# Patient Record
Sex: Female | Born: 1955
Health system: Southern US, Community
[De-identification: ages and names within clinical notes are randomized; demographics above are authoritative.]

## PROBLEM LIST (undated history)

## (undated) DIAGNOSIS — K624 Stenosis of anus and rectum: Secondary | ICD-10-CM

## (undated) DIAGNOSIS — K389 Disease of appendix, unspecified: Secondary | ICD-10-CM

## (undated) DIAGNOSIS — K5790 Diverticulosis of intestine, part unspecified, without perforation or abscess without bleeding: Secondary | ICD-10-CM

## (undated) DIAGNOSIS — K429 Umbilical hernia without obstruction or gangrene: Secondary | ICD-10-CM

## (undated) DIAGNOSIS — K5100 Ulcerative (chronic) pancolitis without complications: Secondary | ICD-10-CM

## (undated) DIAGNOSIS — I10 Essential (primary) hypertension: Secondary | ICD-10-CM

## (undated) DIAGNOSIS — E538 Deficiency of other specified B group vitamins: Secondary | ICD-10-CM

## (undated) DIAGNOSIS — K6289 Other specified diseases of anus and rectum: Secondary | ICD-10-CM

## (undated) DIAGNOSIS — K5010 Crohn's disease of large intestine without complications: Secondary | ICD-10-CM

## (undated) DIAGNOSIS — N8110 Cystocele, unspecified: Secondary | ICD-10-CM

## (undated) DIAGNOSIS — D509 Iron deficiency anemia, unspecified: Secondary | ICD-10-CM

## (undated) HISTORY — PX: INCISIONAL HERNIA REPAIR: SHX193

## (undated) HISTORY — PX: TUBAL LIGATION: SHX77

## (undated) HISTORY — DX: Other specified diseases of anus and rectum: K62.89

## (undated) HISTORY — DX: Ulcerative (chronic) pancolitis without complications: K51.00

## (undated) HISTORY — DX: Umbilical hernia without obstruction or gangrene: K42.9

## (undated) HISTORY — DX: Deficiency of other specified B group vitamins: E53.8

## (undated) HISTORY — DX: Crohn's disease of large intestine without complications: K50.10

## (undated) HISTORY — PX: DILATION AND CURETTAGE OF UTERUS: SHX78

## (undated) HISTORY — DX: Essential (primary) hypertension: I10

## (undated) HISTORY — DX: Iron deficiency anemia, unspecified: D50.9

## (undated) HISTORY — DX: Diverticulosis of intestine, part unspecified, without perforation or abscess without bleeding: K57.90

## (undated) HISTORY — PX: COLONOSCOPY: SHX174

## (undated) HISTORY — DX: Stenosis of anus and rectum: K62.4

## (undated) HISTORY — DX: Disease of appendix, unspecified: K38.9

## (undated) HISTORY — DX: Cystocele, unspecified: N81.10

## (undated) HISTORY — PX: APPENDECTOMY: SHX54

---

## 1998-01-22 ENCOUNTER — Other Ambulatory Visit: Admission: RE | Admit: 1998-01-22 | Discharge: 1998-01-22 | Payer: Self-pay | Admitting: Gynecology

## 1999-01-26 ENCOUNTER — Other Ambulatory Visit: Admission: RE | Admit: 1999-01-26 | Discharge: 1999-01-26 | Payer: Self-pay | Admitting: Gynecology

## 1999-08-18 ENCOUNTER — Encounter: Admission: RE | Admit: 1999-08-18 | Discharge: 1999-08-18 | Payer: Self-pay | Admitting: Gynecology

## 1999-08-18 ENCOUNTER — Encounter: Payer: Self-pay | Admitting: Gynecology

## 2000-02-11 ENCOUNTER — Encounter: Admission: RE | Admit: 2000-02-11 | Discharge: 2000-02-11 | Payer: Self-pay | Admitting: Gynecology

## 2000-02-11 ENCOUNTER — Encounter: Payer: Self-pay | Admitting: Gynecology

## 2000-02-16 ENCOUNTER — Other Ambulatory Visit: Admission: RE | Admit: 2000-02-16 | Discharge: 2000-02-16 | Payer: Self-pay | Admitting: Gynecology

## 2001-01-26 ENCOUNTER — Other Ambulatory Visit: Admission: RE | Admit: 2001-01-26 | Discharge: 2001-01-26 | Payer: Self-pay | Admitting: Gynecology

## 2001-02-10 ENCOUNTER — Encounter: Payer: Self-pay | Admitting: Gynecology

## 2001-02-10 ENCOUNTER — Encounter: Admission: RE | Admit: 2001-02-10 | Discharge: 2001-02-10 | Payer: Self-pay | Admitting: Gynecology

## 2001-02-16 ENCOUNTER — Encounter: Payer: Self-pay | Admitting: Gynecology

## 2001-02-16 ENCOUNTER — Encounter: Admission: RE | Admit: 2001-02-16 | Discharge: 2001-02-16 | Payer: Self-pay | Admitting: Gynecology

## 2001-08-22 ENCOUNTER — Encounter: Admission: RE | Admit: 2001-08-22 | Discharge: 2001-08-22 | Payer: Self-pay | Admitting: Gynecology

## 2001-08-22 ENCOUNTER — Encounter: Payer: Self-pay | Admitting: Gynecology

## 2002-02-06 ENCOUNTER — Other Ambulatory Visit: Admission: RE | Admit: 2002-02-06 | Discharge: 2002-02-06 | Payer: Self-pay | Admitting: Gynecology

## 2002-02-20 ENCOUNTER — Encounter: Admission: RE | Admit: 2002-02-20 | Discharge: 2002-02-20 | Payer: Self-pay | Admitting: Gynecology

## 2002-02-20 ENCOUNTER — Encounter: Payer: Self-pay | Admitting: Gynecology

## 2003-04-17 ENCOUNTER — Encounter: Admission: RE | Admit: 2003-04-17 | Discharge: 2003-04-17 | Payer: Self-pay | Admitting: Gynecology

## 2003-04-17 ENCOUNTER — Encounter: Payer: Self-pay | Admitting: Gynecology

## 2003-05-20 ENCOUNTER — Other Ambulatory Visit: Admission: RE | Admit: 2003-05-20 | Discharge: 2003-05-20 | Payer: Self-pay | Admitting: Gynecology

## 2004-04-27 ENCOUNTER — Encounter: Admission: RE | Admit: 2004-04-27 | Discharge: 2004-04-27 | Payer: Self-pay | Admitting: Gynecology

## 2004-11-26 ENCOUNTER — Other Ambulatory Visit: Admission: RE | Admit: 2004-11-26 | Discharge: 2004-11-26 | Payer: Self-pay | Admitting: Gynecology

## 2006-01-13 ENCOUNTER — Encounter: Admission: RE | Admit: 2006-01-13 | Discharge: 2006-01-13 | Payer: Self-pay | Admitting: Gynecology

## 2006-01-13 ENCOUNTER — Other Ambulatory Visit: Admission: RE | Admit: 2006-01-13 | Discharge: 2006-01-13 | Payer: Self-pay | Admitting: Gynecology

## 2007-02-13 ENCOUNTER — Encounter: Admission: RE | Admit: 2007-02-13 | Discharge: 2007-02-13 | Payer: Self-pay | Admitting: Gynecology

## 2008-02-15 ENCOUNTER — Encounter: Admission: RE | Admit: 2008-02-15 | Discharge: 2008-02-15 | Payer: Self-pay | Admitting: Gynecology

## 2009-02-17 ENCOUNTER — Encounter: Admission: RE | Admit: 2009-02-17 | Discharge: 2009-02-17 | Payer: Self-pay | Admitting: Gynecology

## 2010-02-23 ENCOUNTER — Encounter: Admission: RE | Admit: 2010-02-23 | Discharge: 2010-02-23 | Payer: Self-pay | Admitting: Gynecology

## 2011-04-16 ENCOUNTER — Other Ambulatory Visit: Payer: Self-pay | Admitting: Gynecology

## 2011-04-16 DIAGNOSIS — Z1231 Encounter for screening mammogram for malignant neoplasm of breast: Secondary | ICD-10-CM

## 2011-05-31 ENCOUNTER — Ambulatory Visit
Admission: RE | Admit: 2011-05-31 | Discharge: 2011-05-31 | Disposition: A | Discharge status: Home / Self Care | Payer: BC Managed Care – PPO | Source: Ambulatory Visit | Attending: Gynecology | Admitting: Gynecology

## 2011-05-31 DIAGNOSIS — Z1231 Encounter for screening mammogram for malignant neoplasm of breast: Secondary | ICD-10-CM

## 2012-04-20 ENCOUNTER — Other Ambulatory Visit: Payer: Self-pay | Admitting: Gynecology

## 2012-04-20 DIAGNOSIS — Z1231 Encounter for screening mammogram for malignant neoplasm of breast: Secondary | ICD-10-CM

## 2012-06-19 ENCOUNTER — Ambulatory Visit
Admission: RE | Admit: 2012-06-19 | Discharge: 2012-06-19 | Disposition: A | Discharge status: Home / Self Care | Payer: BC Managed Care – PPO | Source: Ambulatory Visit | Attending: Gynecology | Admitting: Gynecology

## 2012-06-19 DIAGNOSIS — Z1231 Encounter for screening mammogram for malignant neoplasm of breast: Secondary | ICD-10-CM

## 2013-04-27 ENCOUNTER — Other Ambulatory Visit: Payer: Self-pay

## 2013-04-27 DIAGNOSIS — Z1231 Encounter for screening mammogram for malignant neoplasm of breast: Secondary | ICD-10-CM

## 2013-06-25 ENCOUNTER — Ambulatory Visit
Admission: RE | Admit: 2013-06-25 | Discharge: 2013-06-25 | Disposition: A | Discharge status: Home / Self Care | Payer: BC Managed Care – PPO | Source: Ambulatory Visit

## 2013-06-25 DIAGNOSIS — Z1231 Encounter for screening mammogram for malignant neoplasm of breast: Secondary | ICD-10-CM

## 2013-06-28 ENCOUNTER — Other Ambulatory Visit: Payer: Self-pay | Admitting: Gynecology

## 2013-06-28 DIAGNOSIS — R928 Other abnormal and inconclusive findings on diagnostic imaging of breast: Secondary | ICD-10-CM

## 2013-07-03 ENCOUNTER — Ambulatory Visit
Admission: RE | Admit: 2013-07-03 | Discharge: 2013-07-03 | Disposition: A | Discharge status: Home / Self Care | Payer: Self-pay | Source: Ambulatory Visit | Attending: Gynecology | Admitting: Gynecology

## 2013-07-03 DIAGNOSIS — R928 Other abnormal and inconclusive findings on diagnostic imaging of breast: Secondary | ICD-10-CM

## 2014-05-08 ENCOUNTER — Other Ambulatory Visit: Payer: Self-pay

## 2014-05-08 DIAGNOSIS — Z1231 Encounter for screening mammogram for malignant neoplasm of breast: Secondary | ICD-10-CM

## 2014-06-27 ENCOUNTER — Ambulatory Visit
Admission: RE | Admit: 2014-06-27 | Discharge: 2014-06-27 | Disposition: A | Discharge status: Home / Self Care | Payer: BC Managed Care – PPO | Source: Ambulatory Visit

## 2014-06-27 ENCOUNTER — Encounter (INDEPENDENT_AMBULATORY_CARE_PROVIDER_SITE_OTHER): Payer: Self-pay

## 2014-06-27 DIAGNOSIS — Z1231 Encounter for screening mammogram for malignant neoplasm of breast: Secondary | ICD-10-CM

## 2015-05-23 ENCOUNTER — Other Ambulatory Visit: Payer: Self-pay

## 2015-05-23 DIAGNOSIS — Z1231 Encounter for screening mammogram for malignant neoplasm of breast: Secondary | ICD-10-CM

## 2015-07-31 ENCOUNTER — Ambulatory Visit
Admission: RE | Admit: 2015-07-31 | Discharge: 2015-07-31 | Disposition: A | Discharge status: Home / Self Care | Payer: BLUE CROSS/BLUE SHIELD | Source: Ambulatory Visit

## 2015-07-31 DIAGNOSIS — Z1231 Encounter for screening mammogram for malignant neoplasm of breast: Secondary | ICD-10-CM

## 2015-08-15 ENCOUNTER — Telehealth: Payer: Self-pay | Admitting: Internal Medicine

## 2015-08-15 NOTE — Telephone Encounter (Signed)
Received records from [facility]. Patient is requesting to see Dr. Hilarie Fredrickson. Patient is requesting a 2nd opinion regarding crohn's disease. Records placed on Dr. Vena Rua desk for review.

## 2015-08-26 ENCOUNTER — Encounter: Payer: Self-pay | Admitting: Internal Medicine

## 2015-08-26 NOTE — Telephone Encounter (Signed)
Dr. Hilarie Fredrickson reviewed records and has accepted patient. Ok to schedule OV. Left message for patient to return my call.

## 2015-09-10 NOTE — Telephone Encounter (Signed)
Appointment scheduled for 10/31/15 with Dr. Hilarie Fredrickson

## 2015-10-21 ENCOUNTER — Encounter: Payer: Self-pay | Admitting: *Deleted

## 2015-10-31 ENCOUNTER — Encounter: Payer: Self-pay | Admitting: Internal Medicine

## 2015-10-31 ENCOUNTER — Other Ambulatory Visit (INDEPENDENT_AMBULATORY_CARE_PROVIDER_SITE_OTHER): Payer: BLUE CROSS/BLUE SHIELD

## 2015-10-31 ENCOUNTER — Ambulatory Visit (INDEPENDENT_AMBULATORY_CARE_PROVIDER_SITE_OTHER): Payer: BLUE CROSS/BLUE SHIELD | Admitting: Internal Medicine

## 2015-10-31 VITALS — BP 134/64 | HR 72 | Ht 62.4 in | Wt 173.0 lb

## 2015-10-31 DIAGNOSIS — K5080 Crohn's disease of both small and large intestine without complications: Secondary | ICD-10-CM | POA: Diagnosis not present

## 2015-10-31 DIAGNOSIS — K50119 Crohn's disease of large intestine with unspecified complications: Secondary | ICD-10-CM

## 2015-10-31 DIAGNOSIS — K5010 Crohn's disease of large intestine without complications: Secondary | ICD-10-CM

## 2015-10-31 LAB — COMPREHENSIVE METABOLIC PANEL
ALBUMIN: 4.1 g/dL (ref 3.5–5.2)
ALT: 16 U/L (ref 0–35)
AST: 18 U/L (ref 0–37)
Alkaline Phosphatase: 100 U/L (ref 39–117)
BUN: 15 mg/dL (ref 6–23)
CALCIUM: 9.6 mg/dL (ref 8.4–10.5)
CHLORIDE: 104 meq/L (ref 96–112)
CO2: 29 meq/L (ref 19–32)
Creatinine, Ser: 0.75 mg/dL (ref 0.40–1.20)
GFR: 83.84 mL/min (ref >60.00)
Glucose, Bld: 98 mg/dL (ref 70–99)
Potassium: 3.7 mEq/L (ref 3.5–5.1)
Sodium: 139 mEq/L (ref 135–145)
Total Bilirubin: 0.5 mg/dL (ref 0.2–1.2)
Total Protein: 7.4 g/dL (ref 6.0–8.3)

## 2015-10-31 LAB — CBC WITH DIFFERENTIAL/PLATELET
BASOS PCT: 0.2 % (ref 0.0–3.0)
Basophils Absolute: 0 10*3/uL (ref 0.0–0.1)
EOS ABS: 0.1 10*3/uL (ref 0.0–0.7)
EOS PCT: 2.1 % (ref 0.0–5.0)
HEMATOCRIT: 36.5 % (ref 36.0–46.0)
HEMOGLOBIN: 12 g/dL (ref 12.0–15.0)
LYMPHS PCT: 21.8 % (ref 12.0–46.0)
Lymphs Abs: 1.1 10*3/uL (ref 0.7–4.0)
MCHC: 32.9 g/dL (ref 30.0–36.0)
MCV: 79.1 fl (ref 78.0–100.0)
MONOS PCT: 8.5 % (ref 3.0–12.0)
Monocytes Absolute: 0.4 10*3/uL (ref 0.1–1.0)
NEUTROS ABS: 3.3 10*3/uL (ref 1.4–7.7)
Neutrophils Relative %: 67.4 % (ref 43.0–77.0)
PLATELETS: 312 10*3/uL (ref 150.0–400.0)
RBC: 4.61 Mil/uL (ref 3.87–5.11)
RDW: 15 % (ref 11.5–15.5)
WBC: 5 10*3/uL (ref 4.0–10.5)

## 2015-10-31 LAB — VITAMIN B12: VITAMIN B 12: 303 pg/mL (ref 211–911)

## 2015-10-31 LAB — HIGH SENSITIVITY CRP: CRP HIGH SENSITIVITY: 15.46 mg/L — AB (ref 0.000–5.000)

## 2015-10-31 LAB — IGA: IGA: 317 mg/dL (ref 68–378)

## 2015-10-31 MED ORDER — MESALAMINE ER 500 MG PO CPCR: 1000.0000 mg | ORAL_CAPSULE | Freq: Three times a day (TID) | ORAL | Status: DC

## 2015-10-31 NOTE — Patient Instructions (Addendum)
Your physician has requested that you go to the basement for lab work before leaving today. We have sent  medications to your pharmacy for you to pick up at your convenience. Stop taking Lialda. Follow up in 3 months.

## 2015-10-31 NOTE — Progress Notes (Signed)
Patient ID: Donna Richmond, female   DOB: 02-12-1956, 60 y.o.   MRN: [id number] HPI: Donna Richmond is a 60 yo female with PMH of ileocolonic Crohn's disease, hypertension and B12 deficiency who is seen to obtain second opinion for her history of Crohn's disease. She was diagnosed with ileitis consistent with Crohn's disease at the time of screening colonoscopy in 2013. Because of lack of symptoms no therapy was given. She had multiple discussions with Dr. Lyndel Richmond regarding the diagnosis. Repeat colonoscopy was performed in October 2016 which showed terminal ileal stricture with surrounding ulcerations and erythema. Ulcerations also involving the ileocecal valve but not the cecum. The majority of the colon was normal but there were a few aphthous erosions in the rectum within 5 cm of the anal verge. There was also thickening in the anorectum felt to be scar formation which was also biopsied. These pathology results showed chronic active ileitis with ulceration and villous atrophy from the terminal ileum. In the rectum was chronic active proctitis with ulceration. In the anorectum there was anorectal mucosa with mild chronic active inflammation and foci of superficial mucosal erosion. After this procedure she was initially started on apriso and was changed to Lialda 2.4 g daily on samples. Both drugs have been cost prohibitive.  She is here today with her daughter and husband.  She denies issues with diarrhea or abdominal pain. She does have sensitivities in her diet which can cause crampy abdominal pain and bloating. These are food specifically salad and milk. For some reason french fries seems to bother her. If she avoids these foods she denies issues with abdominal pain and diarrhea. She reports one formed stool a day without blood or melena. She denies perianal pain or drainage. Good appetite and no upper GI or hepatobiliary complaint. No dysphagia or odynophagia. Prior to the [place] she had noticed some  thinner stools but this has improved with the Lialda. No family history of colon cancer or IBD.  Past Medical History  Diagnosis Date  . Ulcerative ileocolitis ([place])   . Hypertension   . B12 deficiency   . Proctitis     Past Surgical History  Procedure Laterality Date  . Dilation and curettage of uterus    . Tubal ligation     Meds Lialda 2.4 g daily tribenzor 40-5-12.5 mg daily   Family History  Problem Relation Age of Onset  . Colon cancer Neg Hx     Social History  Substance Use Topics  . Smoking status: Never Smoker   . Smokeless tobacco: Never Used  . Alcohol Use: No    ROS: As per history of present illness, otherwise negative  BP 134/64 mmHg  Pulse 72  Ht 5' 2.4" (1.585 m)  Wt 173 lb (78.472 kg)  BMI 31.24 kg/m2 Constitutional: Well-developed and well-nourished. No distress. HEENT: Normocephalic and atraumatic. Oropharynx is clear and moist. No oropharyngeal exudate. Conjunctivae are normal.  No scleral icterus. Neck: Neck supple. Trachea midline. Cardiovascular: Normal rate, regular rhythm and intact distal pulses. No M/R/G Pulmonary/chest: Effort normal and breath sounds normal. No wheezing, rales or rhonchi. Abdominal: Soft, nontender, nondistended. Bowel sounds active throughout. There are no masses palpable. No hepatosplenomegaly. Extremities: no clubbing, cyanosis, or edema Lymphadenopathy: No cervical adenopathy noted. Neurological: Alert and oriented to person place and time. Skin: Skin is warm and dry. No rashes noted. Psychiatric: Normal mood and affect. Behavior is normal.  RELEVANT LABS AND IMAGING: CBC    Component Value Date/Time   WBC 5.0 10/31/2015  1440   RBC 4.61 10/31/2015 1440   HGB 12.0 10/31/2015 1440   HCT 36.5 10/31/2015 1440   PLT 312.0 10/31/2015 1440   MCV 79.1 10/31/2015 1440   MCHC 32.9 10/31/2015 1440   RDW 15.0 10/31/2015 1440   LYMPHSABS 1.1 10/31/2015 1440   MONOABS 0.4 10/31/2015 1440   EOSABS 0.1 10/31/2015  1440   BASOSABS 0.0 10/31/2015 1440    CMP     Component Value Date/Time   NA 139 10/31/2015 1440   K 3.7 10/31/2015 1440   CL 104 10/31/2015 1440   CO2 29 10/31/2015 1440   GLUCOSE 98 10/31/2015 1440   BUN 15 10/31/2015 1440   CREATININE 0.75 10/31/2015 1440   CALCIUM 9.6 10/31/2015 1440   PROT 7.4 10/31/2015 1440   ALBUMIN 4.1 10/31/2015 1440   AST 18 10/31/2015 1440   ALT 16 10/31/2015 1440   ALKPHOS 100 10/31/2015 1440   BILITOT 0.5 10/31/2015 1440    ASSESSMENT/PLAN: 60 yo female with PMH of ileocolonic Crohn's disease, hypertension and B12 deficiency who is seen to obtain second opinion for her history of Crohn's disease.  1. Crohn's ileitis and Crohn's proctitis/perianal Crohn's -- she was seeking second opinion because she has never had symptoms which she attributes definitely to Crohn's disease and has been hesitant about escalation of therapy to include biologic medications. We had a long and thorough discussion today regarding Crohn's disease the etiology, pathophysiology and natural history. The disease seems to have progressed to involve the rectum and perianus between 2013 and 2016. She has never had hospitalization for Crohn's nor any small bowel obstruction. She does appear to have narrowing at the ileocecal valve along with active inflammation in this area. We discussed 5-ASA medications, immunomodulators and Biologics. I recommended a low residue diet. She also seems to have a lactose intolerance and I encouraged her to avoid lactose. I recommend Pentasa rather than Lialda as it has some small bowel activity given her ileitis. Hopefully Pentasa will be affordable to her and covered by her insurance. Will start 1 g 3 times a day. I discussed that often 5-ASA medications alone are not enough to control Crohn's disease and we may need to escalate therapy. I would like to give her time to think about this and judge response to Pentasa. Return in 3 months for further discussion  and follow-up. Check CRP, CBC, CMP, celiac panel and B12 today      Cc:No referring provider defined for this encounter.

## 2015-11-03 ENCOUNTER — Telehealth: Payer: Self-pay

## 2015-11-03 LAB — TISSUE TRANSGLUTAMINASE, IGA: TISSUE TRANSGLUTAMINASE AB, IGA: 1 U/mL (ref <4)

## 2015-11-03 NOTE — Telephone Encounter (Signed)
Pt states the pentasa Dr. Hilarie Fredrickson prescribed is going to cost her 955.00. States she needs something less expensive sent to the pharmacy. Please advise.

## 2015-11-04 NOTE — Telephone Encounter (Signed)
Left message for pt to call back  °

## 2015-11-04 NOTE — Telephone Encounter (Signed)
What mesalamine product is preferred by her insurance?  Previously Apriso and Lialda were cost prohibitive

## 2015-11-04 NOTE — Telephone Encounter (Signed)
Pt called back and her insurance company prefers balsalazide or sulfasalazine.

## 2015-11-04 NOTE — Telephone Encounter (Signed)
Spoke with pt and she is aware and will call back when she knows what mesalamine is preferred.

## 2015-11-05 ENCOUNTER — Other Ambulatory Visit: Payer: Self-pay

## 2015-11-05 MED ORDER — BALSALAZIDE DISODIUM 750 MG PO CAPS: 2250.0000 mg | ORAL_CAPSULE | Freq: Three times a day (TID) | ORAL | Status: DC

## 2015-11-05 NOTE — Telephone Encounter (Signed)
Pt aware, script sent to pharmacy. Recall in epic for OV.

## 2015-11-05 NOTE — Telephone Encounter (Signed)
Trial of balsalazide 2.25 g TID This is a 5-asa medication without small bowel activity.  They can be marginally helpful in Crohn's.  Little downside trying, but I expect she would do best with biologic therapy Followup with me in 3 months

## 2015-11-07 ENCOUNTER — Encounter: Payer: Self-pay | Admitting: Internal Medicine

## 2015-12-01 ENCOUNTER — Encounter: Payer: Self-pay | Admitting: Internal Medicine

## 2016-02-03 ENCOUNTER — Other Ambulatory Visit (INDEPENDENT_AMBULATORY_CARE_PROVIDER_SITE_OTHER): Payer: BLUE CROSS/BLUE SHIELD

## 2016-02-03 ENCOUNTER — Ambulatory Visit (INDEPENDENT_AMBULATORY_CARE_PROVIDER_SITE_OTHER): Payer: BLUE CROSS/BLUE SHIELD | Admitting: Internal Medicine

## 2016-02-03 ENCOUNTER — Encounter: Payer: Self-pay | Admitting: Internal Medicine

## 2016-02-03 VITALS — BP 122/60 | HR 68 | Ht 62.5 in | Wt 169.0 lb

## 2016-02-03 DIAGNOSIS — R5383 Other fatigue: Secondary | ICD-10-CM | POA: Diagnosis not present

## 2016-02-03 DIAGNOSIS — K50818 Crohn's disease of both small and large intestine with other complication: Secondary | ICD-10-CM | POA: Diagnosis not present

## 2016-02-03 DIAGNOSIS — K50119 Crohn's disease of large intestine with unspecified complications: Secondary | ICD-10-CM | POA: Diagnosis not present

## 2016-02-03 DIAGNOSIS — R109 Unspecified abdominal pain: Secondary | ICD-10-CM | POA: Diagnosis not present

## 2016-02-03 LAB — CBC WITH DIFFERENTIAL/PLATELET
BASOS PCT: 0.1 % (ref 0.0–3.0)
Basophils Absolute: 0 10*3/uL (ref 0.0–0.1)
EOS ABS: 0.1 10*3/uL (ref 0.0–0.7)
Eosinophils Relative: 1.4 % (ref 0.0–5.0)
HEMATOCRIT: 35.9 % — AB (ref 36.0–46.0)
Hemoglobin: 11.9 g/dL — ABNORMAL LOW (ref 12.0–15.0)
LYMPHS ABS: 1.1 10*3/uL (ref 0.7–4.0)
LYMPHS PCT: 15.3 % (ref 12.0–46.0)
MCHC: 33.2 g/dL (ref 30.0–36.0)
MCV: 77.5 fl — ABNORMAL LOW (ref 78.0–100.0)
Monocytes Absolute: 0.4 10*3/uL (ref 0.1–1.0)
Monocytes Relative: 6.1 % (ref 3.0–12.0)
NEUTROS ABS: 5.4 10*3/uL (ref 1.4–7.7)
Neutrophils Relative %: 77.1 % — ABNORMAL HIGH (ref 43.0–77.0)
PLATELETS: 339 10*3/uL (ref 150.0–400.0)
RBC: 4.63 Mil/uL (ref 3.87–5.11)
RDW: 15 % (ref 11.5–15.5)
WBC: 7 10*3/uL (ref 4.0–10.5)

## 2016-02-03 NOTE — Patient Instructions (Addendum)
Your physician has requested that you go to the basement for the following lab work before leaving today: Cbc, cmp, crp, quant gold, hepatitis b and c serologies, b12, lipase  You have been scheduled for an MR enterography of the abdomen/pelvis and MR pelvis at [facility] on Wednesday 02/11/16. Your appointment time is 3:00 pm. Please arrive at 1:30 pm for registration purposes. Please make certain not to have anything to eat or drink 6 hours prior to your test. In addition, if you have any metal in your body, have a pacemaker or defibrillator, please be sure to let your ordering physician know. This test typically takes 45 minutes to 1 hour to complete.  If you are age 66 or older, your body mass index should be between 23-30. Your Body mass index is 30.4 kg/(m^2). If this is out of the aforementioned range listed, please consider follow up with your Primary Care Provider.  If you are age 43 or younger, your body mass index should be between 19-25. Your Body mass index is 30.4 kg/(m^2). If this is out of the aformentioned range listed, please consider follow up with your Primary Care Provider.

## 2016-02-04 LAB — COMPREHENSIVE METABOLIC PANEL
ALBUMIN: 4.1 g/dL (ref 3.5–5.2)
ALK PHOS: 88 U/L (ref 39–117)
ALT: 11 U/L (ref 0–35)
AST: 18 U/L (ref 0–37)
BILIRUBIN TOTAL: 0.4 mg/dL (ref 0.2–1.2)
BUN: 13 mg/dL (ref 6–23)
CALCIUM: 9.6 mg/dL (ref 8.4–10.5)
CHLORIDE: 102 meq/L (ref 96–112)
CO2: 27 mEq/L (ref 19–32)
CREATININE: 0.75 mg/dL (ref 0.40–1.20)
GFR: 83.77 mL/min (ref >60.00)
Glucose, Bld: 101 mg/dL — ABNORMAL HIGH (ref 70–99)
Potassium: 4 mEq/L (ref 3.5–5.1)
Sodium: 140 mEq/L (ref 135–145)
TOTAL PROTEIN: 7.6 g/dL (ref 6.0–8.3)

## 2016-02-04 LAB — HEPATITIS B SURFACE ANTIGEN: Hepatitis B Surface Ag: NEGATIVE

## 2016-02-04 LAB — HEPATITIS C ANTIBODY: HCV Ab: NEGATIVE

## 2016-02-04 LAB — VITAMIN B12: Vitamin B-12: >1500 pg/mL — ABNORMAL HIGH (ref 211–911)

## 2016-02-04 LAB — HEPATITIS B SURFACE ANTIBODY,QUALITATIVE: HEP B S AB: NEGATIVE

## 2016-02-04 LAB — HEPATITIS B CORE ANTIBODY, TOTAL: HEP B C TOTAL AB: NONREACTIVE

## 2016-02-04 LAB — HIGH SENSITIVITY CRP: CRP HIGH SENSITIVITY: 19.93 mg/L — AB (ref 0.000–5.000)

## 2016-02-04 LAB — LIPASE: Lipase: 39 U/L (ref 11.0–59.0)

## 2016-02-04 NOTE — Progress Notes (Signed)
Subjective:    Patient ID: Donna Richmond, female    DOB: Nov 15, 1955, 60 y.o.   MRN: [id number]  HPI Donna Richmond is a 60 yo with past medical history of ileocolonic and perianal Crohn's disease, hypertension and B12 deficiency who is here for follow-up. She was initially seen in January 2017 and second opinion. She is here today with her daughter. After her last visit we attempted to start Pentasa due to active ileal disease seen at the time of ileocolonoscopy performed by Dr. Lyndel Safe in [place] in October 2016. This was cost prohibitive as had been Lialda and Apriso previously. Balsalazide was approved and started at 2.25 g 3 times a day.  She took this but noticed burning and perianal irritation with defecation. She also felt like her stools were a bit more loose on this dose. After 2 months she decreased the dose to 3 capsules twice a day and the perianal burning resolved. She's not sure this medication has helped in any significant way.  More recently she's felt considerably fatigued and tired with low energy. She feels like she has 3 bad days compared to every 1 good day. She's also been having nausea but no vomiting. She has midabdominal pain that can radiate into her back which gets worse after eating. She avoids eating because she often has loose stools after a meal. She endorses a good appetite and stable weight. She denies fevers but endorses night sweats. She's having a bowel movement at least every morning and every afternoon but more frequently if she eats a full meal. Occasionally she sees scant blood with bowel movements. She was documented to have influenza B in March.  She described these "thickened spots" that she feels on her scan lateral to her anus. These tend to "pop up" every now and then but she denies that they hurt or drain. They are red in color. They last several days to a week and resolved. He had one of these recently after having considerable diarrhea. She denies any  other rash. She's used Vaseline to the perianal skin to help with irritation.   Review of Systems As per history of present illness, otherwise negative  Current Medications, Allergies, Past Medical History, Past Surgical History, Family History and Social History were reviewed in [employer] record.     Objective:   Physical Exam BP 122/60 mmHg  Pulse 68  Ht 5' 2.5" (1.588 m)  Wt 169 lb (76.658 kg)  BMI 30.40 kg/m2 Constitutional: Well-developed and well-nourished. No distress. HEENT: Normocephalic and atraumatic. Oropharynx is clear and moist. No oropharyngeal exudate. Conjunctivae are normal.  No scleral icterus. Neck: Neck supple. Trachea midline. Cardiovascular: Normal rate, regular rhythm and intact distal pulses. No M/R/G Pulmonary/chest: Effort normal and breath sounds normal. No wheezing, rales or rhonchi. Abdominal: Soft, Mild midabdominal tenderness without rebound or guarding, nondistended. Bowel sounds active throughout. No hepatosplenomegaly. Rectal: Perianal skin thickening with erosion and excoriation of the anal canal which is not overly tender. Mild anal canal stenosis, no fistula or area of fluctuance seen today Extremities: no clubbing, cyanosis, or edema Lymphadenopathy: No cervical adenopathy noted. Neurological: Alert and oriented to person place and time. Skin: Skin is warm and dry. No rashes noted. Psychiatric: Normal mood and affect. Behavior is normal.  CBC    Component Value Date/Time   WBC 5.0 10/31/2015 1440   RBC 4.61 10/31/2015 1440   HGB 12.0 10/31/2015 1440   HCT 36.5 10/31/2015 1440   PLT 312.0 10/31/2015  1440   MCV 79.1 10/31/2015 1440   MCHC 32.9 10/31/2015 1440   RDW 15.0 10/31/2015 1440   LYMPHSABS 1.1 10/31/2015 1440   MONOABS 0.4 10/31/2015 1440   EOSABS 0.1 10/31/2015 1440   BASOSABS 0.0 10/31/2015 1440    CMP     Component Value Date/Time   NA 139 10/31/2015 1440   K 3.7 10/31/2015 1440   CL 104  10/31/2015 1440   CO2 29 10/31/2015 1440   GLUCOSE 98 10/31/2015 1440   BUN 15 10/31/2015 1440   CREATININE 0.75 10/31/2015 1440   CALCIUM 9.6 10/31/2015 1440   PROT 7.4 10/31/2015 1440   ALBUMIN 4.1 10/31/2015 1440   AST 18 10/31/2015 1440   ALT 16 10/31/2015 1440   ALKPHOS 100 10/31/2015 1440   BILITOT 0.5 10/31/2015 1440    hsCRP 15 (Jan 2017)  Lab Results  Component Value Date   [id number] 622 10/31/2015   Colonoscopy performed by Dr. Lyndel Safe from October 2016 again reviewed with patient and daughter -- ileitis with TI stricturing, ileocecal valve ulceration and erythema. Few aphthous erosions in the rectum with thickening in the anorectum. Chronic active ileitis from ileal biopsies. Chronic active proctitis from rectal biopsies. Chronic active inflammation and foci of mucosal erosion from the anorectum.    Assessment & Plan:  60 yo with past medical history of ileocolonic and perianal Crohn's disease, hypertension and B12 deficiency who is here for follow-up.  1. Ileal and Crohn's proctitis with perianal involvement -- no dramatic response from 5-ASA therapy which is not unexpected. The bulk of her symptoms are felt secondary to active ileal and perianal Crohn's disease. The "skin thickening" that she feels may be secondary to fistulous connection from the anorectum. Perianal pyoderma felt less likely. We spent a great guilt time today discussing escalation of therapy and I think she would be very appropriate and best treated by the addition of a biologic medication. We discussed the risks, benefits and alternatives. She still wishes to think more about this and discuss it with family. I feel that therapy with a biologic agent could make her disease and life considerably better. We discussed the risks which would include infection, malignancy specifically T-cell lymphoma, rash, demyelinating disease, heart failure, and even death. --MR enterography to assess disease activity plus MR  pelvis to evaluate for fistula --Quantiferon gold and viral hepatitis testing in anticipation of biologic initiation (Remicade would be my first choice if affordable/attainable) --Repeat CBC, CMP and CRP --Stop balsalazide due to lack of response --Follow-up in 4-8 weeks, sooner if necessary  45 minutes spent with the patient today. Greater than 50% was spent in counseling and coordination of care with the patient

## 2016-02-05 LAB — QUANTIFERON TB GOLD ASSAY (BLOOD)
INTERFERON GAMMA RELEASE ASSAY: NEGATIVE
Mitogen-Nil: >10 IU/mL
Quantiferon Nil Value: 0.06 IU/mL
Quantiferon Tb Ag Minus Nil Value: 0.04 IU/mL

## 2016-02-11 ENCOUNTER — Ambulatory Visit (HOSPITAL_COMMUNITY)
Admission: RE | Admit: 2016-02-11 | Discharge: 2016-02-11 | Disposition: A | Discharge status: Home / Self Care | Payer: BLUE CROSS/BLUE SHIELD | Source: Ambulatory Visit | Attending: Internal Medicine | Admitting: Internal Medicine

## 2016-02-11 ENCOUNTER — Ambulatory Visit (HOSPITAL_COMMUNITY): Admission: RE | Admit: 2016-02-11 | Payer: BLUE CROSS/BLUE SHIELD | Source: Ambulatory Visit

## 2016-02-11 DIAGNOSIS — K50818 Crohn's disease of both small and large intestine with other complication: Secondary | ICD-10-CM

## 2016-02-11 MED ORDER — GADOBENATE DIMEGLUMINE 529 MG/ML IV SOLN
15.0000 mL | Freq: Once | INTRAVENOUS | Status: AC | PRN
  Administered 2016-02-11: 15 mL via INTRAVENOUS

## 2016-02-13 ENCOUNTER — Other Ambulatory Visit: Payer: Self-pay

## 2016-02-13 ENCOUNTER — Ambulatory Visit (HOSPITAL_COMMUNITY)
Admission: RE | Admit: 2016-02-13 | Discharge: 2016-02-13 | Disposition: A | Discharge status: Home / Self Care | Payer: BLUE CROSS/BLUE SHIELD | Source: Ambulatory Visit | Attending: Internal Medicine | Admitting: Internal Medicine

## 2016-02-13 ENCOUNTER — Ambulatory Visit (HOSPITAL_COMMUNITY): Admission: RE | Admit: 2016-02-13 | Payer: BLUE CROSS/BLUE SHIELD | Source: Ambulatory Visit

## 2016-02-13 ENCOUNTER — Ambulatory Visit (INDEPENDENT_AMBULATORY_CARE_PROVIDER_SITE_OTHER): Payer: BLUE CROSS/BLUE SHIELD | Admitting: Internal Medicine

## 2016-02-13 DIAGNOSIS — Z23 Encounter for immunization: Secondary | ICD-10-CM | POA: Diagnosis not present

## 2016-02-13 DIAGNOSIS — K603 Anal fistula: Secondary | ICD-10-CM | POA: Diagnosis not present

## 2016-02-13 DIAGNOSIS — K50818 Crohn's disease of both small and large intestine with other complication: Secondary | ICD-10-CM | POA: Insufficient documentation

## 2016-02-13 DIAGNOSIS — N838 Other noninflammatory disorders of ovary, fallopian tube and broad ligament: Secondary | ICD-10-CM | POA: Diagnosis not present

## 2016-02-13 MED ORDER — GADOBENATE DIMEGLUMINE 529 MG/ML IV SOLN
20.0000 mL | Freq: Once | INTRAVENOUS | Status: AC | PRN
  Administered 2016-02-13: 16 mL via INTRAVENOUS

## 2016-02-17 ENCOUNTER — Telehealth: Payer: Self-pay | Admitting: Internal Medicine

## 2016-02-17 NOTE — Telephone Encounter (Signed)
I called to discuss the results of the recent mr enterography and mri pelvis performed to eval Crohn's symptoms and concern for active disease including perianal involvement See result for details -- active ileitis and perianal fistula without complicating feature at present Balsalazide was stopped after last visit She reports improvement in most of her complaints made recently at her OV with me She now reports improved fatigue, resolved diarrhea (reports stool as formed now), denies perianal pain, and no abd pain.  Resolved nausea. Possibly some improvement due to diarrhea induced from balsalazide (not common, but possible side effect)  I have recommended escalation of therapy to biologics given active, now known to be fistulizing, Crohn's disease Remicade or Humira would be my 1st choice She requests more time to discuss with family She is aware that her symptoms are very unlikely to improve without escalation of therapy and she will be at risk for complication such as worsening fistulas, infections, abscesses, and possibly obstructions. Quantiferon gold neg, Hep B neg and she has already started vaccination series The [facility] website was recommended for her to obtain more resources Time provided for questions and answers (both she and her daughter had many).  They thanked me for the call. I will wait to here back from her Office visit next available (to ensure follow-up)

## 2016-02-18 NOTE — Telephone Encounter (Signed)
Left voicemail for patient to call back. 

## 2016-02-19 NOTE — Telephone Encounter (Signed)
Left voicemail for patient to call back. 

## 2016-02-20 NOTE — Telephone Encounter (Signed)
I have spoken to Donna Richmond and have advised of appointment scheduled 04/21/16 @ 4 pm. She verbalizes understanding.

## 2016-02-23 ENCOUNTER — Telehealth: Payer: Self-pay | Admitting: Internal Medicine

## 2016-02-23 NOTE — Telephone Encounter (Signed)
Left voicemail for [facility] to call back if she still has questions.

## 2016-02-24 NOTE — Telephone Encounter (Signed)
I have spoken to [place] and questions have been answered.

## 2016-03-16 ENCOUNTER — Ambulatory Visit (INDEPENDENT_AMBULATORY_CARE_PROVIDER_SITE_OTHER): Payer: BLUE CROSS/BLUE SHIELD | Admitting: Internal Medicine

## 2016-03-16 DIAGNOSIS — K50113 Crohn's disease of large intestine with fistula: Secondary | ICD-10-CM

## 2016-03-16 DIAGNOSIS — Z23 Encounter for immunization: Secondary | ICD-10-CM

## 2016-04-21 ENCOUNTER — Encounter: Payer: Self-pay | Admitting: Internal Medicine

## 2016-04-21 ENCOUNTER — Ambulatory Visit (INDEPENDENT_AMBULATORY_CARE_PROVIDER_SITE_OTHER): Payer: BLUE CROSS/BLUE SHIELD | Admitting: Internal Medicine

## 2016-04-21 ENCOUNTER — Other Ambulatory Visit (INDEPENDENT_AMBULATORY_CARE_PROVIDER_SITE_OTHER): Payer: BLUE CROSS/BLUE SHIELD

## 2016-04-21 VITALS — BP 122/78 | HR 80 | Ht 62.0 in | Wt 174.0 lb

## 2016-04-21 DIAGNOSIS — K50013 Crohn's disease of small intestine with fistula: Secondary | ICD-10-CM

## 2016-04-21 DIAGNOSIS — K50113 Crohn's disease of large intestine with fistula: Secondary | ICD-10-CM | POA: Diagnosis not present

## 2016-04-21 DIAGNOSIS — K5000 Crohn's disease of small intestine without complications: Secondary | ICD-10-CM

## 2016-04-21 DIAGNOSIS — D509 Iron deficiency anemia, unspecified: Secondary | ICD-10-CM

## 2016-04-21 LAB — CBC WITH DIFFERENTIAL/PLATELET
BASOS ABS: 0 10*3/uL (ref 0.0–0.1)
Basophils Relative: 0.1 % (ref 0.0–3.0)
EOS ABS: 0.1 10*3/uL (ref 0.0–0.7)
Eosinophils Relative: 2.1 % (ref 0.0–5.0)
HCT: 35.7 % — ABNORMAL LOW (ref 36.0–46.0)
Hemoglobin: 12.1 g/dL (ref 12.0–15.0)
LYMPHS ABS: 1.2 10*3/uL (ref 0.7–4.0)
Lymphocytes Relative: 19.8 % (ref 12.0–46.0)
MCHC: 33.9 g/dL (ref 30.0–36.0)
MCV: 78.4 fl (ref 78.0–100.0)
MONO ABS: 0.5 10*3/uL (ref 0.1–1.0)
MONOS PCT: 8.1 % (ref 3.0–12.0)
NEUTROS PCT: 69.9 % (ref 43.0–77.0)
Neutro Abs: 4.1 10*3/uL (ref 1.4–7.7)
PLATELETS: 282 10*3/uL (ref 150.0–400.0)
RBC: 4.55 Mil/uL (ref 3.87–5.11)
RDW: 16.9 % — ABNORMAL HIGH (ref 11.5–15.5)
WBC: 5.8 10*3/uL (ref 4.0–10.5)

## 2016-04-21 LAB — IBC PANEL
IRON: 18 ug/dL — AB (ref 42–145)
Saturation Ratios: 5.1 % — ABNORMAL LOW (ref 20.0–50.0)
TRANSFERRIN: 253 mg/dL (ref 212.0–360.0)

## 2016-04-21 LAB — HIGH SENSITIVITY CRP: CRP, High Sensitivity: 12.74 mg/L — ABNORMAL HIGH (ref 0.000–5.000)

## 2016-04-21 NOTE — Progress Notes (Signed)
Subjective:    Patient ID: Donna Richmond, female    DOB: 05/29/1956, 60 y.o.   MRN: [id number]  HPI Donna Richmond is a 60 year old female with a history of ileocolonic and perianal Crohn's disease who is here for follow-up. She was last seen on 02/03/2016. After this visit MRI was performed of the pelvis along with MR enterography. This showed active ileal and terminal ileal Crohn's disease. MR pelvis showed intersphincteric perianal fistula extending to the medial left gluteal cleft without abscess. There is also a simple periovarian/paratubal left adnexal cyst with normal ovaries. She had been treated with balsalazide but she felt like this worsened diarrhea and was making her sicker so we decided to stop it. She follows up now to discuss her Crohn's disease. I have spoken to her on the phone as well as her daughter and this is documented in the medical record.  Today she reports she is feeling very well. She denies all GI complaint. She states that she no longer has abdominal pain or perirectal pain. No perirectal drainage or tenderness. She denies diarrhea and constipation. She's having one formed bowel movement each morning without blood or melena. She reports a great appetite though she does avoid salads because these previously made her sicker. She denies nausea and vomiting. Denies heartburn and trouble swallowing. No fevers or chills. Denies night sweats. She really doesn't feel that she needs escalation of therapy at present.  Review of Systems As per history of present illness, otherwise negative  Current Medications, Allergies, Past Medical History, Past Surgical History, Family History and Social History were reviewed in [employer] record.     Objective:   Physical Exam BP 122/78 mmHg  Pulse 80  Ht 5' 2"  (1.575 m)  Wt 174 lb (78.926 kg)  BMI 31.82 kg/m2 Constitutional: Well-developed and well-nourished. No distress. HEENT: Normocephalic and atraumatic.  Oropharynx is clear and moist. No oropharyngeal exudate. Conjunctivae are normal.  No scleral icterus. Neck: Neck supple. Trachea midline. Cardiovascular: Normal rate, regular rhythm and intact distal pulses. No M/R/G Pulmonary/chest: Effort normal and breath sounds normal. No wheezing, rales or rhonchi. Abdominal: Soft, nontender, nondistended. Bowel sounds active throughout. There are no masses palpable. No hepatosplenomegaly. Extremities: no clubbing, cyanosis, or edema Lymphadenopathy: No cervical adenopathy noted. Neurological: Alert and oriented to person place and time. Skin: Skin is warm and dry. No rashes noted. Psychiatric: Normal mood and affect. Behavior is normal.   MR ABDOMEN AND PELVIS WITHOUT AND WITH CONTRAST (MR ENTEROGRAPHY)   TECHNIQUE: Multiplanar, multisequence MRI of the abdomen and pelvis was performed both before and during bolus administration of intravenous contrast. Negative oral contrast VoLumen was given.   CONTRAST:  45m MULTIHANCE GADOBENATE DIMEGLUMINE 529 MG/ML IV SOLN   COMPARISON:  None.   FINDINGS: COMBINED FINDINGS FOR BOTH MR ABDOMEN AND PELVIS   There is marked wall thickening, inflammation and enhancement of the distal and terminal ileum along with surrounding inflammation in the perienteric fat. Findings consistent with active Crohn's disease. No complicating features such as abscess or fistula. The cecum and right colon are unremarkable. The appendix is normal.   No obvious rectal or anorectal disease is identified.   The liver and spleen are unremarkable. The gallbladder is normal. No common bile duct dilatation. The pancreas is normal. The adrenal glands and kidneys are normal.   No mesenteric or retroperitoneal mass or adenopathy. The aorta and branch vessels are normal. The major venous structures are patent. Small right-sided inflammatory ileocolic lymph  nodes are noted.   The stomach, duodenum, remaining small bowel and colon  are unremarkable.   IMPRESSION: MR findings consistent with acute/ active Crohn's disease involving the distal and terminal ileum. No findings for colonic involvement. The rectum and anus appear normal.     Electronically Signed   By: Marijo Sanes M.D.   On: 02/11/2016 16:11 MRI PELVIS WITHOUT AND WITH CONTRAST   TECHNIQUE: Multiplanar multisequence MR imaging of the pelvis was performed both before and after administration of intravenous contrast.   CONTRAST:  45m MULTIHANCE GADOBENATE DIMEGLUMINE 529 MG/ML IV SOLN   COMPARISON:  02/11/2016 MRI enterography study.   FINDINGS: Uterus: The normal size anteverted uterus measures 6.5 x 3.4 x 4.6 cm. No uterine fibroids. Inner myometrium (junctional zone) measures 7 mm in thickness, which is within normal limits. Endometrium measures to mm in bilayer thickness, which is within normal limits. No endometrial cavity fluid or focal endometrial mass.   Ovaries and Adnexa: The right ovary measures 1.7 x 1.0 x 0.7 cm and is normal. The left ovary measures 1.6 x 1.2 x 1.1 cm and is normal. There is a simple 1.7 x 1.1 x 1.1 cm paraovarian/paratubal left adnexal cyst. There are no suspicious ovarian or adnexal masses.   Bladder: Normal.  Normal visualized urethra.   Bowel: There is a simple linear intersphincteric perianal fistula between 5:00 and 6:00 extending inferiorly to the medial left gluteal cleft with enhancing margins and surrounding edema in the medial left greater than right gluteal clefts (series 8/image 25 and series 9/ images 25 and 26). No additional perianal fistulas. No discrete abscess. There is a wall of distal ileum in the right deep pelvis with circumferential wall thickening and wall hyperenhancement, consistent with active Crohn ileitis, as demonstrated on the recent MRI enterography. No dilated bowel loops.   Vascular/Lymphatic: No pathologically enlarged lymph nodes in the pelvis.   Other: Small volume  free fluid in the deep pelvis. No focal pelvic fluid collection.   Musculoskeletal: No aggressive appearing focal osseous lesions.   IMPRESSION: 1. Simple linear intersphincteric left perianal fistula between 5:00 and 6:00 extending inferiorly to the medial left gluteal cleft. No abscess. 2. Active Crohn ileitis in the distal ileum in the right pelvis, as demonstrated on the MR enterography study from 2 days prior. Small volume free fluid in the deep pelvis is likely reactive . 3. Small simple paraovarian/paratubal left adnexal cyst. Normal ovaries. No suspicious adnexal masses.     Electronically Signed   By: JIlona SorrelM.D.   On: 02/13/2016 09:45   CBC    Component Value Date/Time   WBC 5.8 04/21/2016 1643   RBC 4.55 04/21/2016 1643   HGB 12.1 04/21/2016 1643   HCT 35.7* 04/21/2016 1643   PLT 282.0 04/21/2016 1643   MCV 78.4 04/21/2016 1643   MCHC 33.9 04/21/2016 1643   RDW 16.9* 04/21/2016 1643   LYMPHSABS 1.2 04/21/2016 1643   MONOABS 0.5 04/21/2016 1643   EOSABS 0.1 04/21/2016 1643   BASOSABS 0.0 04/21/2016 1643    CMP     Component Value Date/Time   NA 140 02/03/2016 1727   K 4.0 02/03/2016 1727   CL 102 02/03/2016 1727   CO2 27 02/03/2016 1727   GLUCOSE 101* 02/03/2016 1727   BUN 13 02/03/2016 1727   CREATININE 0.75 02/03/2016 1727   CALCIUM 9.6 02/03/2016 1727   PROT 7.6 02/03/2016 1727   ALBUMIN 4.1 02/03/2016 1727   AST 18 02/03/2016 1727   ALT  11 02/03/2016 1727   ALKPHOS 88 02/03/2016 1727   BILITOT 0.4 02/03/2016 1727    Iron/TIBC/Ferritin/ %Sat    Component Value Date/Time   IRON 18* 04/21/2016 1643   IRONPCTSAT 5.1* 04/21/2016 1643    hsCRPs 15--> 19 --> 12 (today)    Assessment & Plan:  60 year old female with a history of ileocolonic and perianal Crohn's disease who is here for follow-up.   1. Ileal and perianal Crohn's disease with fistula -- we have very convincing evidence for her active Crohn's disease by MR and previously by  colonoscopy and biopsy with Dr. Lyndel Safe.  I have discussed Crohn's disease including the natural history and possible complications with her and her daughter extensively. She did not respond to balsalazide and it is possible this caused a paradoxical diarrhea. We discussed the 5-ASA therapy often does not improve Crohn's disease and certainly doesn't help fistulous disease. I strongly recommended biologic therapy given her known Crohn's disease and fistula in the anal sphincter. I do think the "hard knots" that she has felt previously in her buttocks are related to this fistula. At present she reports complete resolution of all symptoms and she is hesitant to begin any new therapy. After thorough discussion of Biologics, the risks, benefits and alternatives she does not wish to proceed. She prefers expectant management. I've explained to her that she, without therapy, is at risk for complications of her Crohn's disease. She understands this. I will have her back in 6 months and sooner should she develop recurrent troublesome abdominal/GI symptoms.  2. IDA -- mild anemia was noticed after last visit and iron studies were checked today confirming iron deficiency. We'll recommended Integra 1 capsule daily, Colace if necessary for stool softening. Repeat iron studies in 3 months  25 minutes spent with the patient today. Greater than 50% was spent in counseling and coordination of care with the patient

## 2016-04-21 NOTE — Patient Instructions (Signed)
Your physician has requested that you go to the basement for the following lab work before leaving today: CBC, IBC, CRP  Please follow up with Dr Hilarie Fredrickson in 6 months.  If you are age 60 or older, your body mass index should be between 23-30. Your Body mass index is 31.82 kg/(m^2). If this is out of the aforementioned range listed, please consider follow up with your Primary Care Provider.  If you are age 25 or younger, your body mass index should be between 19-25. Your Body mass index is 31.82 kg/(m^2). If this is out of the aformentioned range listed, please consider follow up with your Primary Care Provider.

## 2016-04-22 ENCOUNTER — Other Ambulatory Visit: Payer: Self-pay

## 2016-04-22 DIAGNOSIS — D509 Iron deficiency anemia, unspecified: Secondary | ICD-10-CM

## 2016-04-22 MED ORDER — INTEGRA 62.5-62.5-40-3 MG PO CAPS: 62.5000 mg | ORAL_CAPSULE | Freq: Every day | ORAL | Status: DC

## 2016-04-26 ENCOUNTER — Telehealth: Payer: Self-pay | Admitting: Internal Medicine

## 2016-04-27 ENCOUNTER — Other Ambulatory Visit: Payer: Self-pay | Admitting: *Deleted

## 2016-04-27 NOTE — Telephone Encounter (Signed)
Zach from [facility] needs clarification on this med. Best [phone] option 2 and 2 again.

## 2016-04-27 NOTE — Telephone Encounter (Signed)
Spoke with patient and told her Integra will be ready tomorrow at her pharmacy. Reviewed results of labs with patient again.

## 2016-04-27 NOTE — Telephone Encounter (Signed)
Spoke with Thedore Mins and verified Integra rx.

## 2016-04-27 NOTE — Telephone Encounter (Signed)
Rx for Integra called to pharmacy. It will be ordered and available for her tomorrow. Left a message for patient to call back.

## 2016-04-29 ENCOUNTER — Telehealth: Payer: Self-pay | Admitting: Internal Medicine

## 2016-04-29 DIAGNOSIS — D509 Iron deficiency anemia, unspecified: Secondary | ICD-10-CM

## 2016-04-29 MED ORDER — INTEGRA 62.5-62.5-40-3 MG PO CAPS: 62.5000 mg | ORAL_CAPSULE | Freq: Every day | ORAL | 3 refills | Status: DC

## 2016-04-29 NOTE — Telephone Encounter (Signed)
Refill has been sent as requested, the pt will call with any further problems with rx.

## 2016-06-11 ENCOUNTER — Other Ambulatory Visit: Payer: Self-pay | Admitting: Obstetrics & Gynecology

## 2016-06-11 DIAGNOSIS — Z1231 Encounter for screening mammogram for malignant neoplasm of breast: Secondary | ICD-10-CM

## 2016-07-15 ENCOUNTER — Other Ambulatory Visit (INDEPENDENT_AMBULATORY_CARE_PROVIDER_SITE_OTHER): Payer: BLUE CROSS/BLUE SHIELD

## 2016-07-15 DIAGNOSIS — D509 Iron deficiency anemia, unspecified: Secondary | ICD-10-CM

## 2016-07-15 LAB — CBC WITH DIFFERENTIAL/PLATELET
BASOS ABS: 0 10*3/uL (ref 0.0–0.1)
Basophils Relative: 0.3 % (ref 0.0–3.0)
Eosinophils Absolute: 0.1 10*3/uL (ref 0.0–0.7)
Eosinophils Relative: 2.6 % (ref 0.0–5.0)
HCT: 34.2 % — ABNORMAL LOW (ref 36.0–46.0)
Hemoglobin: 11.6 g/dL — ABNORMAL LOW (ref 12.0–15.0)
LYMPHS ABS: 1.3 10*3/uL (ref 0.7–4.0)
Lymphocytes Relative: 23.3 % (ref 12.0–46.0)
MCHC: 33.8 g/dL (ref 30.0–36.0)
MCV: 80.9 fl (ref 78.0–100.0)
MONOS PCT: 9 % (ref 3.0–12.0)
Monocytes Absolute: 0.5 10*3/uL (ref 0.1–1.0)
NEUTROS PCT: 64.8 % (ref 43.0–77.0)
Neutro Abs: 3.5 10*3/uL (ref 1.4–7.7)
Platelets: 266 10*3/uL (ref 150.0–400.0)
RBC: 4.23 Mil/uL (ref 3.87–5.11)
RDW: 14.1 % (ref 11.5–15.5)
WBC: 5.4 10*3/uL (ref 4.0–10.5)

## 2016-07-15 LAB — IBC PANEL
IRON: 20 ug/dL — AB (ref 42–145)
SATURATION RATIOS: 6.7 % — AB (ref 20.0–50.0)
Transferrin: 213 mg/dL (ref 212.0–360.0)

## 2016-07-15 LAB — FERRITIN: FERRITIN: 77.2 ng/mL (ref 10.0–291.0)

## 2016-07-19 ENCOUNTER — Other Ambulatory Visit: Payer: Self-pay

## 2016-07-19 DIAGNOSIS — D509 Iron deficiency anemia, unspecified: Secondary | ICD-10-CM

## 2016-08-05 ENCOUNTER — Other Ambulatory Visit: Payer: Self-pay | Admitting: Obstetrics & Gynecology

## 2016-08-05 ENCOUNTER — Ambulatory Visit
Admission: RE | Admit: 2016-08-05 | Discharge: 2016-08-05 | Disposition: A | Discharge status: Home / Self Care | Payer: BLUE CROSS/BLUE SHIELD | Source: Ambulatory Visit | Attending: Obstetrics & Gynecology | Admitting: Obstetrics & Gynecology

## 2016-08-05 DIAGNOSIS — Z1231 Encounter for screening mammogram for malignant neoplasm of breast: Secondary | ICD-10-CM

## 2016-08-09 ENCOUNTER — Other Ambulatory Visit: Payer: Self-pay | Admitting: Obstetrics & Gynecology

## 2016-08-09 ENCOUNTER — Telehealth: Payer: Self-pay | Admitting: Internal Medicine

## 2016-08-09 DIAGNOSIS — R928 Other abnormal and inconclusive findings on diagnostic imaging of breast: Secondary | ICD-10-CM

## 2016-08-10 NOTE — Telephone Encounter (Signed)
Discussed with pt that she can try OTC anti-gas meds. Pt verbalized understanding.

## 2016-08-10 NOTE — Telephone Encounter (Signed)
Left message for pt to call back  °

## 2016-08-13 ENCOUNTER — Ambulatory Visit
Admission: RE | Admit: 2016-08-13 | Discharge: 2016-08-13 | Disposition: A | Discharge status: Home / Self Care | Payer: BLUE CROSS/BLUE SHIELD | Source: Ambulatory Visit | Attending: Obstetrics & Gynecology | Admitting: Obstetrics & Gynecology

## 2016-08-13 DIAGNOSIS — R928 Other abnormal and inconclusive findings on diagnostic imaging of breast: Secondary | ICD-10-CM

## 2016-08-19 ENCOUNTER — Telehealth: Payer: Self-pay | Admitting: Internal Medicine

## 2016-08-19 NOTE — Telephone Encounter (Signed)
Pt called stating she is having abdominal pain on her right side near her belly button that goes around to her back. She also states that she has nausea with this pain sometimes.  Daughter called and states her mother does not know how to describe her symptoms. States pt is having a lot of pain, at times cannot stand for clothing to touch her skin, or for people to sit on the couch with her. Pt is now scheduled to see Ellouise Newer PA tomorrow at 2:15pm. Daughter wanted to let Dr. Hilarie Fredrickson know about her mother's symptoms. Pt called last week and stated she was having gas pains and wanted something called in for this. Discussed with pt that we recommend gas-x otc for gas pains. Daughter thought this message would have been sent to Dr. Hilarie Fredrickson but I explained that when a pt calls with gas pains that is not sent to the physician. Daughter states she will not be able to come with her mother to the appt tomorrow. Dr. Hilarie Fredrickson notified.

## 2016-08-19 NOTE — Telephone Encounter (Signed)
Pt states she is having pain on the right side of her abdomen beside her belly button that goes around to her back. States that sometimes the pain causes her to be come nauseated. Pt has history of crohn's and requests to be seen. Pt scheduled to see Alonza Bogus PA 08/23/16@3pm . Pt aware of appt.

## 2016-08-19 NOTE — Telephone Encounter (Signed)
Noted  

## 2016-08-19 NOTE — Telephone Encounter (Signed)
Pt with hx of Crohn's disease, see my last office note. She has deferred treatment in the past despite our discussions. I expect her current symptoms are related to her IBD. She will be evaluated tomorrow in clinic by Ellouise Newer, PA-C

## 2016-08-20 ENCOUNTER — Ambulatory Visit (INDEPENDENT_AMBULATORY_CARE_PROVIDER_SITE_OTHER): Payer: BLUE CROSS/BLUE SHIELD | Admitting: Physician Assistant

## 2016-08-20 ENCOUNTER — Encounter (INDEPENDENT_AMBULATORY_CARE_PROVIDER_SITE_OTHER): Payer: Self-pay

## 2016-08-20 ENCOUNTER — Encounter: Payer: Self-pay | Admitting: Physician Assistant

## 2016-08-20 VITALS — BP 110/70 | HR 74 | Ht 63.5 in | Wt 163.0 lb

## 2016-08-20 DIAGNOSIS — K50918 Crohn's disease, unspecified, with other complication: Secondary | ICD-10-CM

## 2016-08-20 MED ORDER — PREDNISONE 10 MG PO TABS: 10.0000 mg | ORAL_TABLET | Freq: Every day | ORAL | 0 refills | Status: DC

## 2016-08-20 MED ORDER — PREDNISONE 10 MG PO TABS: ORAL_TABLET | ORAL | 0 refills | Status: DC

## 2016-08-20 NOTE — Progress Notes (Addendum)
Chief Complaint: Abdominal Pain, Diarrhea  HPI:  Donna Richmond is a 60 year old Caucasian female with past medical history of ileocolonic and perianal Crohn's disease, who follows with Dr. Hilarie Fredrickson. She was last seen in office on 04/21/2016. At that time she had had a recent MRI of the pelvis along with MR enterography which showed active ileal and terminal ileal Crohn's disease. MR of the pelvis showed intersphincteric perianal fistula extending to the medial left gluteal cleft without abscess. There was also simple periovarian/paratubal left adnexal cyst with normal ovaries. She had been treated with Balsalazide in the past but felt like this worsened her diarrhea and made her sicker. So this was stopped. At time of last office visit she was doing well and had no symptoms, so declined Biologics which were discussed in great detail.   Today, the patient is accompanied by her husband who does assist with her history. They explain that the patient has had problems for the past 7-8 weeks. Her husband believes this is due to the fact that she "is eating whatever she wants", she is not following any strict diet and has had an increase in symptoms. The patient describes a periumbilical abdominal pain that radiates to her right side and through to her back. This pain is sometimes rated as a 10/10 and is associated with bloating and multiple diarrheal stools per day. Patient does have a feeling of chills and has experienced some weight loss as well as appetite loss over the past week. Patient tells me that this is somewhat relieved when she lays down. Her pain episodes last for 10-15 minutes at a time, but have worsened over the past week, to the point where she has taken off of work for the past 3 days due to abdominal pain which is unrelieved. Patient has also been unable to eat over this time period and if she does , will experience severe abdominal pain about an hour after eating. The patient tells me that she is  ready to start Humira or whatever she can do to make herself feel better.   Patient denies fever, bright red blood or black tarry sticky stool, fatigue, nausea, vomiting, frequent heartburn or reflux, dysphagia or symptoms that awaken her at night.  Past Medical History:  Diagnosis Date  . B12 deficiency   . Hypertension   . Proctitis   . 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 DILATION AND CURETTAGE OF UTERUS    . TUBAL LIGATION      Current Outpatient Prescriptions  Medication Sig Dispense Refill  . Fe Fum-FePoly-Vit C-Vit B3 (INTEGRA) 62.5-62.5-40-3 MG CAPS Take 62.5 mg by mouth daily. 30 capsule 3  . Olmesartan-Amlodipine-HCTZ 40-5-12.5 MG TABS Take 1 capsule by mouth daily.    . vitamin B-12 (CYANOCOBALAMIN) 1000 MCG tablet Take 1,000 mcg by mouth daily.     No current facility-administered medications for this visit.     Allergies as of 08/20/2016 - Review Complete 08/20/2016  Allergen Reaction Noted  . Biaxin [clarithromycin] Hives 02/03/2016    Family History  Problem Relation Age of Onset  . Colon cancer Neg Hx     Social History   Social History  . Marital status: Married    Spouse name: Biomedical scientist  . Number of children: 2  . Years of education: N/A   Occupational History  . [employer]     Social History Main Topics  . Smoking status: Never Smoker  .  Smokeless tobacco: Never Used  . Alcohol use No  . Drug use: No  . Sexual activity: Not on file   Other Topics Concern  . Not on file   Social History Narrative  . No narrative on file    Review of Systems:     Constitutional: Positive for weight loss No fever, chills, weakness or fatigue Cardiovascular: No chest pain, chest pressure or palpitations   Respiratory: No SOB or cough Gastrointestinal: See HPI and otherwise negative   Physical Exam:  Vital signs: BP 110/70   Pulse 74   Ht 5' 3.5" (1.613 m)   Wt 163 lb (73.9 kg)   BMI 28.42 kg/m     Constitutional:   Pleasant Caucasian female appears to be in mild distress, Well developed, Well nourished, alert and cooperative Respiratory: Respirations even and unlabored. Lungs clear to auscultation bilaterally.   No wheezes, crackles, or rhonchi.  Cardiovascular: Normal S1, S2. No MRG. Regular rate and rhythm. No peripheral edema, cyanosis or pallor.  Gastrointestinal:  Soft, mild distension, moderate ttp in periumbilical region and right side of abdomen. No rebound or guarding. Normal bowel sounds. No appreciable masses or hepatomegaly. Msk:  Symmetrical without gross deformities. Without edema, no deformity or joint abnormality.   Most recent: CBC    Component Value Date/Time   WBC 5.4 07/15/2016 1543   RBC 4.23 07/15/2016 1543   HGB 11.6 (L) 07/15/2016 1543   HCT 34.2 (L) 07/15/2016 1543   PLT 266.0 07/15/2016 1543   MCV 80.9 07/15/2016 1543   MCHC 33.8 07/15/2016 1543   RDW 14.1 07/15/2016 1543   LYMPHSABS 1.3 07/15/2016 1543   MONOABS 0.5 07/15/2016 1543   EOSABS 0.1 07/15/2016 1543   BASOSABS 0.0 07/15/2016 1543    CMP     Component Value Date/Time   NA 140 02/03/2016 1727   K 4.0 02/03/2016 1727   CL 102 02/03/2016 1727   CO2 27 02/03/2016 1727   GLUCOSE 101 (H) 02/03/2016 1727   BUN 13 02/03/2016 1727   CREATININE 0.75 02/03/2016 1727   CALCIUM 9.6 02/03/2016 1727   PROT 7.6 02/03/2016 1727   ALBUMIN 4.1 02/03/2016 1727   AST 18 02/03/2016 1727   ALT 11 02/03/2016 1727   ALKPHOS 88 02/03/2016 1727   BILITOT 0.4 02/03/2016 1727   Hep B surface Ag-neg TB GOLD-neg  Assessment: 1. Crohn's Disease: Ileal and perianal disease with fistula, patient has had previous MRI and colonoscopy with biopsy by Dr. Lyndel Safe. Biologics have been discussed in the past, but were declined back in July of this year as patient was doing well at the time. Currently, she is in a flare experiencing periumbilical abdominal pain rated as a 10 / 10 at times, worse over the past week as  well as an increased frequency of loose stools. She and her husband are ready for this to be "under control".  Plan: 1. Prescribed prednisone taper starting at 40 mg daily, decreasing by 5 mg every week for a total of 8 weeks. 2. Will start the patient on Humira. Did send a note to Rosanne Sack, RN to initiate this process. Patient was told she could possibly get this medication for a lower cost in the past. She will need regular induction dosing 160/80/40 3. Patient has had previous negative hepatitis B surface antigen and TB testing. She is currently getting hepatitis B vaccination series with her last one on December 15 4. Ordered C. difficile stool testing 5. Encouraged the patient to eat  a better diet, directed her to the Crohn's website for further information 6. Case discussed with Dr. Hilarie Fredrickson the time of patient's visit, she should follow with him after initiating Humira in the future.  Ellouise Newer, PA-C [facility] 08/20/2016, 3:01 PM  Cc: Marco Collie, MD   Addendum: Reviewed and agree with management. Patient well-known to me with history of Crohn's ileitis and perianal disease. I recommended Biologics in the past and discussed this in the past with her, her husband and her daughter. In the past with me she has minimize symptoms and asked that we not begin or escalate therapy. We tried her on budesonide which she stated in the past specifically made her "worse". I do think given her disease process she is appropriate for Biologics. We had previously discussed the risks, benefits and alternatives. We discussed the risk of Humira including the risk of infection (including reactivation of latent TB and underlying viral hepatitis), hepatotoxicity, leukopenia, pancreatitis, rash, nausea, malignancy (specifically lymphoma), demyelinating disease, and even heart failure. This was again discussed with Ellouise Newer, PA-C and she is agreeable/wishes to proceed. She will need  followup with me, please schedule if not already done  Jerene Bears, MD

## 2016-08-20 NOTE — Patient Instructions (Addendum)
We have provided you with an order to take to [facility] close to your home.  We sent a prescription for Prednisone 10 mg tablets to your pharmacy. [facility] [address] in [place].   Prednisone 10 mg tablets: 4 tablets x 7 days ( 40 mg) 3 1/2 tablets x 7 days ( 35 mg) 3 tablets x 7 days  ( 30 mg)  2 1/2 tablets x 7 days  ( 25 mg)  2 tablets x 7 days  ( 20 mg)  1 1/2 tablets x 7 days  ( 15 mg)  1 tablet x 7 days  ( 10 mg)

## 2016-08-23 ENCOUNTER — Ambulatory Visit: Payer: BLUE CROSS/BLUE SHIELD | Admitting: Gastroenterology

## 2016-08-23 ENCOUNTER — Telehealth: Payer: Self-pay

## 2016-08-23 ENCOUNTER — Other Ambulatory Visit: Payer: Self-pay | Admitting: Physician Assistant

## 2016-08-23 NOTE — Telephone Encounter (Signed)
Humira script faxed to [facility], ambassador form faxed also.

## 2016-08-23 NOTE — Telephone Encounter (Signed)
-----   Message from Levin Erp, [place] sent at 08/20/2016  3:45 PM EST ----- Regarding: Humira Pyrtle pt would like to be started on Humira. She was told in the past this could be low cost? 5$ per injection per Dr. Hilarie Fredrickson. Will need regular induction dosing 160/80/40. Will need nurse visit prior to starting.  Thanks-JLL

## 2016-08-24 ENCOUNTER — Telehealth: Payer: Self-pay

## 2016-08-24 ENCOUNTER — Other Ambulatory Visit: Payer: Self-pay | Admitting: Internal Medicine

## 2016-08-24 DIAGNOSIS — D509 Iron deficiency anemia, unspecified: Secondary | ICD-10-CM

## 2016-08-24 LAB — CLOSTRIDIUM DIFFICILE BY PCR: CDIFFPCR: POSITIVE — AB

## 2016-08-24 NOTE — Telephone Encounter (Signed)
-----   Message from Levin Erp, [place] sent at 08/24/2016  3:41 PM EST ----- Regarding: FW: C.Diff Positive See Dr. Vena Rua recs. Thaks! JLL  ----- Message ----- From: Jerene Bears, MD Sent: 08/24/2016   3:17 PM To: Levin Erp, PA Subject: RE: C.Diff Positive                            Begin metronidazole 500 TID x 10 days (I think this is her 1st episode, if not then vanco) Would continue the steroid taper as well Would have her followup in 2-3 weeks with you to see how she is doing JMP  ----- Message ----- From: Levin Erp, PA Sent: 08/24/2016   2:44 PM To: Jerene Bears, MD Subject: C.Diff Positive                                Remember this patient we talked about on Friday when you were very busy? CDiff came back today, positive and she was started on Prednisone 77m qd on Friday. This is why I typically wait for results before starting steroids ...but didn't in her case for some reason. Please let me know what I should do now. I appreciate you assistance.  JLEM

## 2016-08-24 NOTE — Telephone Encounter (Signed)
Lavone Nian [place], [place]  Barron Alvine, RN        Please see Dr. Vena Rua recs inculding starting metronidazole 564m TID x10days if this is her first episode of CDIFF, if not vanco and have her follow up with me in 2-3 weeks to see how she is doing if she does not already have follow with him. thx-JLL

## 2016-09-01 NOTE — Telephone Encounter (Signed)
Left message on machine to call back  

## 2016-09-02 ENCOUNTER — Other Ambulatory Visit: Payer: Self-pay

## 2016-09-02 MED ORDER — METRONIDAZOLE 500 MG PO TABS: 500.0000 mg | ORAL_TABLET | Freq: Three times a day (TID) | ORAL | 0 refills | Status: AC

## 2016-09-02 NOTE — Telephone Encounter (Signed)
Left message on machine to call back letter mailed to the pt.  Unable to reach her by phone.

## 2016-09-09 ENCOUNTER — Telehealth: Payer: Self-pay | Admitting: Internal Medicine

## 2016-09-09 NOTE — Telephone Encounter (Signed)
Left message on machine to call back  

## 2016-09-10 NOTE — Telephone Encounter (Signed)
Left message for pt to call back  °

## 2016-09-13 NOTE — Telephone Encounter (Signed)
Pt called and pt has been scheduled to see Ellouise Newer PA 09/22/16@1 :45pm. Pt aware of appt.

## 2016-09-13 NOTE — Telephone Encounter (Signed)
Nurse was unable to reach pt by phone regarding her cdiff result. Script was sent to her pharmacy and picked up by pt on 09/03/16. Left message for pt to call back.

## 2016-09-17 ENCOUNTER — Ambulatory Visit (INDEPENDENT_AMBULATORY_CARE_PROVIDER_SITE_OTHER): Payer: BLUE CROSS/BLUE SHIELD | Admitting: Internal Medicine

## 2016-09-17 DIAGNOSIS — Z23 Encounter for immunization: Secondary | ICD-10-CM

## 2016-09-17 DIAGNOSIS — K50918 Crohn's disease, unspecified, with other complication: Secondary | ICD-10-CM

## 2016-09-20 ENCOUNTER — Telehealth: Payer: Self-pay | Admitting: Internal Medicine

## 2016-09-20 NOTE — Telephone Encounter (Signed)
Noted  

## 2016-09-22 ENCOUNTER — Other Ambulatory Visit (INDEPENDENT_AMBULATORY_CARE_PROVIDER_SITE_OTHER): Payer: BLUE CROSS/BLUE SHIELD

## 2016-09-22 ENCOUNTER — Ambulatory Visit (INDEPENDENT_AMBULATORY_CARE_PROVIDER_SITE_OTHER): Payer: BLUE CROSS/BLUE SHIELD | Admitting: Physician Assistant

## 2016-09-22 ENCOUNTER — Encounter: Payer: Self-pay | Admitting: Physician Assistant

## 2016-09-22 VITALS — BP 122/78 | HR 74 | Ht 63.0 in | Wt 163.0 lb

## 2016-09-22 DIAGNOSIS — K5080 Crohn's disease of both small and large intestine without complications: Secondary | ICD-10-CM | POA: Diagnosis not present

## 2016-09-22 DIAGNOSIS — A0472 Enterocolitis due to Clostridium difficile, not specified as recurrent: Secondary | ICD-10-CM | POA: Diagnosis not present

## 2016-09-22 DIAGNOSIS — K5000 Crohn's disease of small intestine without complications: Secondary | ICD-10-CM

## 2016-09-22 LAB — CBC WITH DIFFERENTIAL/PLATELET
Basophils Absolute: 0 10*3/uL (ref 0.0–0.1)
Basophils Relative: 0 % (ref 0.0–3.0)
EOS PCT: 0.1 % (ref 0.0–5.0)
Eosinophils Absolute: 0 10*3/uL (ref 0.0–0.7)
HEMATOCRIT: 34.3 % — AB (ref 36.0–46.0)
Hemoglobin: 11.6 g/dL — ABNORMAL LOW (ref 12.0–15.0)
LYMPHS ABS: 1.1 10*3/uL (ref 0.7–4.0)
Lymphocytes Relative: 16.3 % (ref 12.0–46.0)
MCHC: 33.8 g/dL (ref 30.0–36.0)
MCV: 81.7 fl (ref 78.0–100.0)
MONOS PCT: 6.4 % (ref 3.0–12.0)
Monocytes Absolute: 0.4 10*3/uL (ref 0.1–1.0)
NEUTROS PCT: 77.2 % — AB (ref 43.0–77.0)
Neutro Abs: 5.3 10*3/uL (ref 1.4–7.7)
Platelets: 312 10*3/uL (ref 150.0–400.0)
RBC: 4.2 Mil/uL (ref 3.87–5.11)
RDW: 15.8 % — ABNORMAL HIGH (ref 11.5–15.5)
WBC: 6.8 10*3/uL (ref 4.0–10.5)

## 2016-09-22 LAB — COMPREHENSIVE METABOLIC PANEL
ALBUMIN: 3.7 g/dL (ref 3.5–5.2)
ALK PHOS: 57 U/L (ref 39–117)
ALT: 11 U/L (ref 0–35)
AST: 10 U/L (ref 0–37)
BILIRUBIN TOTAL: 0.4 mg/dL (ref 0.2–1.2)
BUN: 14 mg/dL (ref 6–23)
CALCIUM: 9.4 mg/dL (ref 8.4–10.5)
CO2: 30 mEq/L (ref 19–32)
Chloride: 102 mEq/L (ref 96–112)
Creatinine, Ser: 0.73 mg/dL (ref 0.40–1.20)
GFR: 86.24 mL/min (ref >60.00)
Glucose, Bld: 124 mg/dL — ABNORMAL HIGH (ref 70–99)
POTASSIUM: 3.7 meq/L (ref 3.5–5.1)
Sodium: 140 mEq/L (ref 135–145)
TOTAL PROTEIN: 6.7 g/dL (ref 6.0–8.3)

## 2016-09-22 NOTE — Patient Instructions (Signed)
Your physician has requested that you go to the basement for the following lab work before leaving today: CBC, CMET  Start Humira as prescribed.

## 2016-09-22 NOTE — Progress Notes (Addendum)
Chief Complaint: Follow up C-Diff, Crohns  HPI:  Donna Richmond is a 60 year old Caucasian female who returns to clinic today for follow-up of her C. difficile and Crohn's disease. The patient really follows with Dr. Hilarie Fredrickson and was last seen in clinic by myself on 08/20/16 at which time she complained of problems over the past 7-8 weeks describing periumbilical abdominal pain rated as a 10/10 at times as well as multiple diarrheal stools and bloating and a feeling of chills. At that time patient started on a prednisone taper and Humira was started. Labs were also done to check for C. difficile which returned positive. Patient was then started on Flagyl 500 mg 3 times a day 10 days.   Today, the patient returns to clinic and tells me that she feels "10 million times better". She finished her Flagyl a few days ago and has one more dose of her Prednisone taper in the morning. She describes that she has had no further chronic loose stools and her abdominal pain is completely better. She does describe one episode of a loose stool a couple of days ago associated with some abdominal pain which was relieved afterwards, but believes that this was related to a chick fil-a chicken and collard's that she ate the night before. This has not recurred since. Overall she feels much better and has no new complaints. She does ask a lot of questions today regarding starting her Humira. Apparently her prescription is waiting at the pharmacy. She has already been contacted by the specialty pharmacy with instructions.   Patient denies fever, chills, blood in her stool, melena, weight loss, fatigue, anorexia, nausea, vomiting, heartburn, reflux or abdominal pain.   Past Medical History:  Diagnosis Date  . B12 deficiency   . Hypertension   . Proctitis   . Ulcerative ileocolitis (Friend)     Past Surgical History:  Procedure Laterality Date  . DILATION AND CURETTAGE OF UTERUS    . TUBAL LIGATION      Current Outpatient  Prescriptions  Medication Sig Dispense Refill  . Fe Fum-FePoly-Vit C-Vit B3 (INTEGRA) 62.5-62.5-40-3 MG CAPS TAKE ONE CAPSULE BY MOUTH EVERY DAY 30 capsule 1  . Olmesartan-Amlodipine-HCTZ 40-5-12.5 MG TABS Take 1 capsule by mouth daily.    . predniSONE (DELTASONE) 10 MG tablet Take 4 tabs x 7 days, take 3 1/2 tabs x 7 days, take 3 tabs x 7 days, take 2 1/2 tabs x 7 days, take 2 tabs x 7 days, take 1 1/2 tabs x 7 days, take 1 tab x 7 days. 140 tablet 0  . vitamin B-12 (CYANOCOBALAMIN) 1000 MCG tablet Take 1,000 mcg by mouth daily.     No current facility-administered medications for this visit.     Allergies as of 09/22/2016 - Review Complete 09/22/2016  Allergen Reaction Noted  . Biaxin [clarithromycin] Hives 02/03/2016    Family History  Problem Relation Age of Onset  . Colon cancer Neg Hx     Social History   Social History  . Marital status: Married    Spouse name: Biomedical scientist  . Number of children: 2  . Years of education: N/A   Occupational History  . [employer]     Social History Main Topics  . Smoking status: Never Smoker  . Smokeless tobacco: Never Used  . Alcohol use No  . Drug use: No  . Sexual activity: Not on file   Other Topics Concern  . Not on file   Social History Narrative  .  No narrative on file    Review of Systems:    Constitutional: No weight loss, fever or chills Gastrointestinal: See HPI and otherwise negative Genitourinary: No dysuria or change in urinary frequency   Physical Exam:  Vital signs: BP 122/78   Pulse 74   Ht 5' 3"  (1.6 m)   Wt 163 lb (73.9 kg)   BMI 28.87 kg/m   Constitutional:   Pleasant Caucasian female appears to be in NAD, Well developed, Well nourished, alert and cooperative Respiratory: Respirations even and unlabored. Lungs clear to auscultation bilaterally.   No wheezes, crackles, or rhonchi.  Cardiovascular: Normal S1, S2. No MRG. Regular rate and rhythm. No peripheral edema, cyanosis or pallor.    Gastrointestinal:  Soft, nondistended, nontender. No rebound or guarding. Normal bowel sounds. No appreciable masses or hepatomegaly. Psychiatric:  Demonstrates good judgement and reason without abnormal affect or behaviors.  Most recent las:  CBC    Component Value Date/Time   WBC 5.4 07/15/2016 1543   RBC 4.23 07/15/2016 1543   HGB 11.6 (L) 07/15/2016 1543   HCT 34.2 (L) 07/15/2016 1543   PLT 266.0 07/15/2016 1543   MCV 80.9 07/15/2016 1543   MCHC 33.8 07/15/2016 1543   RDW 14.1 07/15/2016 1543   LYMPHSABS 1.3 07/15/2016 1543   MONOABS 0.5 07/15/2016 1543   EOSABS 0.1 07/15/2016 1543   BASOSABS 0.0 07/15/2016 1543    CMP     Component Value Date/Time   NA 140 02/03/2016 1727   K 4.0 02/03/2016 1727   CL 102 02/03/2016 1727   CO2 27 02/03/2016 1727   GLUCOSE 101 (H) 02/03/2016 1727   BUN 13 02/03/2016 1727   CREATININE 0.75 02/03/2016 1727   CALCIUM 9.6 02/03/2016 1727   PROT 7.6 02/03/2016 1727   ALBUMIN 4.1 02/03/2016 1727   AST 18 02/03/2016 1727   ALT 11 02/03/2016 1727   ALKPHOS 88 02/03/2016 1727   BILITOT 0.4 02/03/2016 1727    Assessment: 1. Crohn's disease: Ileal and perianal disease with fistula, patient with previous MRI and colonoscopy with biopsy by Dr. Lyndel Safe. At time of last visit 08/20/16 patient agreed to be placed on Humira, this process is almost complete and patient is due to start this within the next week, she has already been contacted by the specialty clinic as far as instructions, currently no problems, remains on Prednisone 5 mg, 1 dose left for tomorrow 2. C diff: Patient tells me that her symptoms have cleared up after Flagyl 500 mg 3 times a day 10 days, this was her first episode of C. difficile  Plan: 1. Patient should start on Humira as prescribed previously 2. Ordered CBC and CMP as it has been a few months since these have been done 3. Patient requests to follow with Dr. Hilarie Fredrickson at her next visit, this will be in 2-3 months 4. Donna Sack RN also spoke with patient while in clinic to clarify process of Humira  Ellouise Newer, Hershal Coria [facility] 09/22/2016, 1:53 PM  Cc: Marco Collie, MD   Addendum: Reviewed and agree with ongoing management. Jerene Bears, MD

## 2016-09-23 ENCOUNTER — Other Ambulatory Visit: Payer: Self-pay

## 2016-09-23 ENCOUNTER — Telehealth: Payer: Self-pay | Admitting: Internal Medicine

## 2016-09-23 MED ORDER — ADALIMUMAB 40 MG/0.8ML ~~LOC~~ AJKT: 40.0000 mg | AUTO-INJECTOR | SUBCUTANEOUS | 0 refills | Status: DC

## 2016-09-23 MED ORDER — ADALIMUMAB 40 MG/0.8ML ~~LOC~~ AJKT: 40.0000 mg | AUTO-INJECTOR | SUBCUTANEOUS | 11 refills | Status: DC

## 2016-09-23 NOTE — Telephone Encounter (Signed)
Spoke with pt and let her know that the prescription has been called in to [place]. Verbal order was given over the phone for the Humira starter pack and the maintenance dose. Let pt know that [facility] is where the script was sent. Pt tearful on the phone, stated that I just did not understand all that she was going through.

## 2016-09-29 ENCOUNTER — Telehealth: Payer: Self-pay | Admitting: Physician Assistant

## 2016-09-29 NOTE — Telephone Encounter (Signed)
Pt wanted to notify that her Donna Richmond will arrive tomorrow and she will begin 09/30/16.  She will call back with any concerns.  Pt was advised she can begin injections but to call if she develops any symptoms such as fever, or illness.

## 2016-10-11 ENCOUNTER — Other Ambulatory Visit (INDEPENDENT_AMBULATORY_CARE_PROVIDER_SITE_OTHER): Payer: BLUE CROSS/BLUE SHIELD

## 2016-10-11 ENCOUNTER — Other Ambulatory Visit: Payer: Self-pay

## 2016-10-11 ENCOUNTER — Telehealth: Payer: Self-pay | Admitting: Internal Medicine

## 2016-10-11 DIAGNOSIS — K5090 Crohn's disease, unspecified, without complications: Secondary | ICD-10-CM

## 2016-10-11 DIAGNOSIS — R1084 Generalized abdominal pain: Secondary | ICD-10-CM

## 2016-10-11 LAB — COMPREHENSIVE METABOLIC PANEL
ALBUMIN: 3.5 g/dL (ref 3.5–5.2)
ALK PHOS: 78 U/L (ref 39–117)
ALT: 9 U/L (ref 0–35)
AST: 11 U/L (ref 0–37)
BILIRUBIN TOTAL: 0.7 mg/dL (ref 0.2–1.2)
BUN: 15 mg/dL (ref 6–23)
CO2: 28 mEq/L (ref 19–32)
Calcium: 9.3 mg/dL (ref 8.4–10.5)
Chloride: 101 mEq/L (ref 96–112)
Creatinine, Ser: 0.73 mg/dL (ref 0.40–1.20)
GFR: 86.22 mL/min (ref >60.00)
Glucose, Bld: 106 mg/dL — ABNORMAL HIGH (ref 70–99)
POTASSIUM: 3.9 meq/L (ref 3.5–5.1)
Sodium: 137 mEq/L (ref 135–145)
TOTAL PROTEIN: 6.5 g/dL (ref 6.0–8.3)

## 2016-10-11 LAB — CBC WITH DIFFERENTIAL/PLATELET
BASOS ABS: 0 10*3/uL (ref 0.0–0.1)
Basophils Relative: 0.5 % (ref 0.0–3.0)
EOS PCT: 0.2 % (ref 0.0–5.0)
Eosinophils Absolute: 0 10*3/uL (ref 0.0–0.7)
HEMATOCRIT: 34.6 % — AB (ref 36.0–46.0)
HEMOGLOBIN: 11.9 g/dL — AB (ref 12.0–15.0)
LYMPHS PCT: 20.2 % (ref 12.0–46.0)
Lymphs Abs: 1.9 10*3/uL (ref 0.7–4.0)
MCHC: 34.3 g/dL (ref 30.0–36.0)
MCV: 80.8 fl (ref 78.0–100.0)
MONOS PCT: 8.5 % (ref 3.0–12.0)
Monocytes Absolute: 0.8 10*3/uL (ref 0.1–1.0)
Neutro Abs: 6.6 10*3/uL (ref 1.4–7.7)
Neutrophils Relative %: 70.6 % (ref 43.0–77.0)
Platelets: 358 10*3/uL (ref 150.0–400.0)
RBC: 4.28 Mil/uL (ref 3.87–5.11)
RDW: 15.1 % (ref 11.5–15.5)
WBC: 9.3 10*3/uL (ref 4.0–10.5)

## 2016-10-11 LAB — AMYLASE: Amylase: 32 U/L (ref 27–131)

## 2016-10-11 LAB — C-REACTIVE PROTEIN: CRP: 7.6 mg/dL (ref 0.5–20.0)

## 2016-10-11 LAB — LIPASE: LIPASE: 6 U/L — AB (ref 11.0–59.0)

## 2016-10-11 MED ORDER — PREDNISONE 10 MG PO TABS: ORAL_TABLET | ORAL | 0 refills | Status: DC

## 2016-10-11 NOTE — Telephone Encounter (Signed)
Family knows if she worsens to go to ER. Also know they can reach the physician oncall by calling the office number.

## 2016-10-11 NOTE — Telephone Encounter (Signed)
Spoke with pt and she states she is sick. Reports she is having pain at the top and bottom of her stomach along with pain in her lower back. She at some yogurt this am and vomited. States she does not have any fever or diarrhea. She has had the 1st dose of her humira induction and is due to take 74m of humira this Friday. Let pt know that there are no appts available for today or tomorrow and that I would send a message to Dr. PHilarie Fredricksonto get his recommendations. Please advise.

## 2016-10-11 NOTE — Telephone Encounter (Signed)
Would recommend CBC, CMP, CRP, amylase and lipase CT scan of the abdomen and pelvis with IV contrast; history of Crohn's disease May start prednisone after the studies; will need to go to the ER if cannot follow through with outpatient plan or condition worsening

## 2016-10-11 NOTE — Telephone Encounter (Signed)
Pt came to office to pick up contrast for CT. Pt slumped over in chair, family with her. Daughter states she is very sick. Pt wanted to know if she could have a note for work for today and tomorrow, note provided. Pt scheduled to see Dr. Hilarie Fredrickson 10/18/16@3pm . Pt wanted to know if she could be out of work until she started feeling better. Discussed with her the process of getting FMLA papers from her employer and turning them in to [facility], address given to pt for [place]. Pt given the script for prednisone and knows to start it after CT scan tomorrow. Dr. Hilarie Fredrickson aware.

## 2016-10-11 NOTE — Telephone Encounter (Signed)
Lab orders in epic and pt to come for labs today. Pt scheduled for CT of A/P at [facility] tomorrow at 2pm, pt to arrive there at 1:45pm. Pt to drink bottle one of contrast at 12noon and bottle two at 1pm. Pt to pick up contrast today.   Please advise regarding prednisone dose that she may start after studies.

## 2016-10-11 NOTE — Telephone Encounter (Signed)
Would recommend

## 2016-10-11 NOTE — Telephone Encounter (Signed)
Based on your report, if she continues to be this ill, and certainly if she worsens, would recommend she go to the ER

## 2016-10-11 NOTE — Telephone Encounter (Signed)
Prednisone 40 mg daily x 7 days, then decrease by 5 mg every 7 days until off Continue Humira with induction Needs OV as well to check on her

## 2016-10-12 ENCOUNTER — Other Ambulatory Visit: Payer: Self-pay

## 2016-10-12 ENCOUNTER — Ambulatory Visit (INDEPENDENT_AMBULATORY_CARE_PROVIDER_SITE_OTHER)
Admission: RE | Admit: 2016-10-12 | Discharge: 2016-10-12 | Disposition: A | Discharge status: Home / Self Care | Payer: BLUE CROSS/BLUE SHIELD | Source: Ambulatory Visit | Attending: Internal Medicine | Admitting: Internal Medicine

## 2016-10-12 DIAGNOSIS — K5090 Crohn's disease, unspecified, without complications: Secondary | ICD-10-CM

## 2016-10-12 DIAGNOSIS — R1084 Generalized abdominal pain: Secondary | ICD-10-CM

## 2016-10-12 MED ORDER — IOPAMIDOL (ISOVUE-300) INJECTION 61%
100.0000 mL | Freq: Once | INTRAVENOUS | Status: AC | PRN
  Administered 2016-10-12: 100 mL via INTRAVENOUS

## 2016-10-13 ENCOUNTER — Other Ambulatory Visit: Payer: Self-pay

## 2016-10-13 MED ORDER — CIPROFLOXACIN HCL 500 MG PO TABS: 500.0000 mg | ORAL_TABLET | Freq: Two times a day (BID) | ORAL | 0 refills | Status: DC

## 2016-10-13 MED ORDER — METRONIDAZOLE 500 MG PO TABS: 500.0000 mg | ORAL_TABLET | Freq: Three times a day (TID) | ORAL | 0 refills | Status: DC

## 2016-10-15 ENCOUNTER — Encounter: Payer: Self-pay | Admitting: *Deleted

## 2016-10-17 ENCOUNTER — Other Ambulatory Visit: Payer: Self-pay | Admitting: Internal Medicine

## 2016-10-17 DIAGNOSIS — D509 Iron deficiency anemia, unspecified: Secondary | ICD-10-CM

## 2016-10-18 ENCOUNTER — Encounter: Payer: Self-pay | Admitting: Internal Medicine

## 2016-10-18 ENCOUNTER — Ambulatory Visit (INDEPENDENT_AMBULATORY_CARE_PROVIDER_SITE_OTHER): Payer: BLUE CROSS/BLUE SHIELD | Admitting: Internal Medicine

## 2016-10-18 ENCOUNTER — Ambulatory Visit: Payer: BLUE CROSS/BLUE SHIELD | Admitting: Internal Medicine

## 2016-10-18 VITALS — BP 132/72 | HR 66

## 2016-10-18 DIAGNOSIS — D500 Iron deficiency anemia secondary to blood loss (chronic): Secondary | ICD-10-CM

## 2016-10-18 DIAGNOSIS — K50814 Crohn's disease of both small and large intestine with abscess: Secondary | ICD-10-CM | POA: Diagnosis not present

## 2016-10-18 MED ORDER — METRONIDAZOLE 500 MG PO TABS: 500.0000 mg | ORAL_TABLET | Freq: Three times a day (TID) | ORAL | 0 refills | Status: DC

## 2016-10-18 MED ORDER — CIPROFLOXACIN HCL 500 MG PO TABS: 500.0000 mg | ORAL_TABLET | Freq: Two times a day (BID) | ORAL | 0 refills | Status: DC

## 2016-10-18 NOTE — Progress Notes (Signed)
Subjective:    Patient ID: Donna Richmond, female    DOB: [birth year], 61 y.o.   MRN: [id number]  HPI Donna Richmond is a 61 year old female with a past medical history of ileocolonic Crohn's disease and perianal Crohn's disease who is here for follow-up. She was last seen in the office in December 2017 but presented last week with worsening acute mid and lower abdominal pain, diarrhea. At that time labs and a CT scan of the abdomen and pelvis were ordered. CT scan showed acute Crohn's disease in the ileum with concern for contained perforation with 2 probable abscesses both smaller than 3 cm. Antibiotics were started with Cipro and Flagyl which she has continued now 5 days.  After being seen in November and December with ongoing abdominal pain consistent with active Crohn's disease the decision was made started Humira. This had been previously recommended that she had remained reluctant.   Humira was started with induction on 10/01/2016 with 160 mg. She redosed on 10/15/2016 with planned 80 mg but with the second injection she flinched and remove the needle before the injection completed. She and her husband are sure she did not receive the entire dose.  For the last 3 days she has felt considerably better. Her abdominal pain has improved dramatically. She's been eating a low residue diet. She's had no fevers. She's noticed some night sweats since starting prednisone. Prednisone was started at 40 mg daily and she has had 6 days. She's tapers to 35 mg after tomorrow. She tolerated the Humira injections well. She's having some loose stools which are nonbloody. Again her abdominal pain is considerably better. She is not having current perianal pain or drainage. She has been off of work through [employer]. She will see her back to work her family does not want her to go back to soon. She tends to work very hard and tries to not miss work. She is here today with her husband and one of her daughters.   Review of  Systems As per history of present illness, otherwise negative  Current Medications, Allergies, Past Medical History, Past Surgical History, Family History and Social History were reviewed in [employer] record.     Objective:   Physical Exam BP 132/72   Pulse 66  Constitutional: Well-developed and well-nourished. No distress. HEENT: Normocephalic and atraumatic. Oropharynx is clear and moist. No oropharyngeal exudate. Conjunctivae are normal.  No scleral icterus. Neck: Neck supple. Trachea midline. Cardiovascular: Normal rate, regular rhythm and intact distal pulses. No M/R/G Pulmonary/chest: Effort normal and breath sounds normal. No wheezing, rales or rhonchi. Abdominal: Soft, Moderate tenderness in the right mid and lower abdomen without rebound or guarding, nondistended. Bowel sounds active throughout. There are no masses palpable. No hepatosplenomegaly. Extremities: no clubbing, cyanosis, or edema Lymphadenopathy: No cervical adenopathy noted. Neurological: Alert and oriented to person place and time. Skin: Skin is warm and dry. No rashes noted. Psychiatric: Normal mood and affect. Behavior is normal.  CBC    Component Value Date/Time   WBC 9.3 10/11/2016 1605   RBC 4.28 10/11/2016 1605   HGB 11.9 (L) 10/11/2016 1605   HCT 34.6 (L) 10/11/2016 1605   PLT 358.0 10/11/2016 1605   MCV 80.8 10/11/2016 1605   MCHC 34.3 10/11/2016 1605   RDW 15.1 10/11/2016 1605   LYMPHSABS 1.9 10/11/2016 1605   MONOABS 0.8 10/11/2016 1605   EOSABS 0.0 10/11/2016 1605   BASOSABS 0.0 10/11/2016 1605   CMP     Component  Value Date/Time   NA 137 10/11/2016 1605   K 3.9 10/11/2016 1605   CL 101 10/11/2016 1605   CO2 28 10/11/2016 1605   GLUCOSE 106 (H) 10/11/2016 1605   BUN 15 10/11/2016 1605   CREATININE 0.73 10/11/2016 1605   CALCIUM 9.3 10/11/2016 1605   PROT 6.5 10/11/2016 1605   ALBUMIN 3.5 10/11/2016 1605   AST 11 10/11/2016 1605   ALT 9 10/11/2016 1605    ALKPHOS 78 10/11/2016 1605   BILITOT 0.7 10/11/2016 1605   Iron/TIBC/Ferritin/ %Sat    Component Value Date/Time   IRON 20 (L) 07/15/2016 1543   FERRITIN 77.2 07/15/2016 1543   IRONPCTSAT 6.7 (L) 07/15/2016 1543   CT ABDOMEN AND PELVIS WITH CONTRAST   TECHNIQUE: Multidetector CT imaging of the abdomen and pelvis was performed using the standard protocol following bolus administration of intravenous contrast.   CONTRAST:  156m ISOVUE-300 IOPAMIDOL (ISOVUE-300) INJECTION 61%   COMPARISON:  MRI 02/11/2016   FINDINGS: Lower chest: The lung bases are clear of acute process. No pleural effusion or pulmonary lesions. The heart is normal in size. Small amount of pericardial fluid without overt effusion. The distal esophagus and aorta are unremarkable. The   Hepatobiliary: No focal hepatic lesions or intrahepatic biliary dilatation. The gallbladder is normal. No common bile duct dilatation.   Pancreas: No mass, inflammation or ductal dilatation.   Spleen: Normal size.  No focal lesions.   Adrenals/Urinary Tract: The adrenal glands and kidneys are unremarkable.   Stomach/Bowel: The stomach, duodenum and proximal and mid small bowel are unremarkable. No inflammatory changes or obstructive findings. There is severe inflammation of the distal and terminal ileum consistent with active Crohn's disease. The terminal ileum demonstrates some fibrofatty changes in the wall consistent with acute on chronic Crohn's disease. There are 2 areas suspicious for small walled-off perforations involving the distal ileum. The more superior and medial collection measures 3 cm. The second area is more inferior and lateral and measures 2.5 cm. This could be intramural abscess. The   No acute cecal inflammation. The cecal tip demonstrates fibrofatty changes and could be due to chronic inflammation. The appendix appears normal except for small appendicoliths.   Vascular/Lymphatic: Numerous small  pericecal hyperplastic lymph nodes.   Age advanced atherosclerotic calcifications involving the aorta but no aneurysm or dissection. The branch vessels are patent. The major venous structures are patent.   Reproductive: The uterus and ovaries are normal.   Other: No pelvic mass or adenopathy. No free pelvic fluid collections. No inguinal mass or adenopathy. No abdominal wall hernia or subcutaneous lesions.   Musculoskeletal: No significant bony findings.   IMPRESSION: CT findings consistent with active Crohn's disease with severe inflammation of the distal and terminal ileum. Small focal walled-off perforations are suggested as discussed above.   No mechanical or functional obstruction.  The colon appears normal.   Suspect appendiceal appendicoliths but no acute inflammation.     Electronically Signed   By: PMarijo SanesM.D.   On: 10/12/2016 20:01      Assessment & Plan:  61year old female with a past medical history of ileocolonic Crohn's disease and perianal Crohn's disease who is here for follow-up.  1. Ileal Crohn's with abscess/perianal Crohn's -- unfortunately she got sicker before Biologics were started. She now regrets the decision not to start Biologics when initially recommended but I am thankful she is feeling better. Plan will be as follows --Lengthen antibiotics with Cipro and Flagyl to a total of 14 days --Continue  Humira, contact your ambassador to get an additional dose to be taken as soon as possible to complete the second induction dose of 80 mg total. Take Humira 40 mg as scheduled on 10/29/2016 followed by 40 mg every 14 days --Wean prednisone as directed by decreasing by 5 mg every 7 days until completed --Repeat CT scan on 11/02/2016 to ensure improvement in intra-abdominal abscesses.  We discussed that if these increase in size she may need IR drainage. --She currently has FMLA work leave through 10/24/2016. If she continues to feel better as she has  been recently I recommended that she can if able, return to work at 50% usual workload 2 weeks. This is subject to change based on her clinical progress and disease activity  2. IDA -- we'll have her hold oral iron as this seems to set her stomach a bit. Will resume what she is feeling better. Will need repeat CBC and iron studies at follow-up. Would expect iron deficiency to improve once Crohn's inflammation is under control  3. B12 deficiency -- continue oral B12 daily 1000 g  50 minutes spent with the patient today. Greater than 50% was spent in counseling and coordination of care with the patient

## 2016-10-18 NOTE — Patient Instructions (Addendum)
Hold Integra x 1 month.  Wean off prednisone as directed.  Call your Humira Ambassador to request an additional Humira pen to replace the one that misfired recently. Inject the pen when you receive it. Resume your regular dose of Humira (once every 2 weeks) on 10/29/16.  Follow a low residue diet.  We will give you 4 additional days of antibiotics to take. A prescription for Cipro and Flagyl has been sent to your pharmacy.  You have been scheduled for a CT scan of the abdomen and pelvis at [facility] ([address]---this is in the same building as [facility] [employer]).   You are scheduled on 11/02/16 at 10:30 am. You should arrive 15 minutes prior to your appointment time for registration. Please follow the written instructions below on the day of your exam:  WARNING: IF YOU ARE ALLERGIC TO IODINE/X-RAY DYE, PLEASE NOTIFY RADIOLOGY IMMEDIATELY AT [phone]! YOU WILL BE GIVEN A 13 HOUR PREMEDICATION PREP.  1) Do not eat or drink anything after 6:30 am (4 hours prior to your test) 2) You have been given 2 bottles of oral contrast to drink. The solution may taste  better if refrigerated, but do NOT add ice or any other liquid to this solution. Shake well before drinking.    Drink 1 bottle of contrast @ 8:30 am (2 hours prior to your exam)  Drink 1 bottle of contrast @ 9:30 am (1 hour prior to your exam)  You may take any medications as prescribed with a small amount of water except for the following: Metformin, Glucophage, Glucovance, Avandamet, Riomet, Fortamet, Actoplus Met, Janumet, Glumetza or Metaglip. The above medications must be held the day of the exam AND 48 hours after the exam.  The purpose of you drinking the oral contrast is to aid in the visualization of your intestinal tract. The contrast solution may cause some diarrhea. Before your exam is started, you will be given a small amount of fluid to drink. Depending on your individual set of symptoms, you may also  receive an intravenous injection of x-ray contrast/dye. Plan on being at [facility] for 30 minutes or longer, depending on the type of exam you are having performed.  This test typically takes 30-45 minutes to complete.  If you have any questions regarding your exam or if you need to reschedule, you may call the CT department at [phone] between the hours of 8:00 am and 5:00 pm, Monday-Friday.  _______________________________________________________________________  If you are age 47 or older, your body mass index should be between 23-30. Your There is no height or weight on file to calculate BMI. If this is out of the aforementioned range listed, please consider follow up with your Primary Care Provider.  If you are age 96 or younger, your body mass index should be between 19-25. Your There is no height or weight on file to calculate BMI. If this is out of the aformentioned range listed, please consider follow up with your Primary Care Provider.

## 2016-10-19 ENCOUNTER — Encounter: Payer: Self-pay | Admitting: *Deleted

## 2016-10-19 ENCOUNTER — Telehealth: Payer: Self-pay | Admitting: Internal Medicine

## 2016-10-19 NOTE — Telephone Encounter (Signed)
Pt states she contacted her employer and they will not do a decreased work schedule for her. She is going to call the [facility] representative and request to be out another 2 weeks. Discussed with pt and let her know when we receive the paperwork Dr. Hilarie Fredrickson will sign off on it. Should place her out through 11/14/16. Dr. Hilarie Fredrickson aware.

## 2016-10-19 NOTE — Telephone Encounter (Signed)
Agree with 2 additional weeks off for FMLA for active Crohn's with abscesses

## 2016-10-22 ENCOUNTER — Telehealth: Payer: Self-pay | Admitting: Internal Medicine

## 2016-10-22 MED ORDER — METRONIDAZOLE 500 MG PO TABS: 500.0000 mg | ORAL_TABLET | Freq: Three times a day (TID) | ORAL | 0 refills | Status: DC

## 2016-10-22 MED ORDER — CIPROFLOXACIN HCL 500 MG PO TABS: 500.0000 mg | ORAL_TABLET | Freq: Two times a day (BID) | ORAL | 0 refills | Status: DC

## 2016-10-22 NOTE — Telephone Encounter (Signed)
Patient notified thawe have not seen paperwork from [employer].  She is given the number to follow up with them. Pharmacy has not received the rx for cipro and flagyl.  Rx sent again

## 2016-10-22 NOTE — Telephone Encounter (Signed)
I left a voicemail authorizing the replacement pens.

## 2016-10-22 NOTE — Telephone Encounter (Signed)
Patient calling back regarding FMLA paperwork. Patient also has questions regarding cipro and flagyl. Best # [phone]

## 2016-10-22 NOTE — Addendum Note (Signed)
Addended by: Marlon Pel on: 10/22/2016 04:45 PM   Modules accepted: Orders

## 2016-10-28 ENCOUNTER — Telehealth: Payer: Self-pay | Admitting: Internal Medicine

## 2016-10-28 NOTE — Telephone Encounter (Signed)
Dr Hilarie Fredrickson, have you seen any paperwork on Ms. Oehler?

## 2016-10-29 NOTE — Telephone Encounter (Signed)
I have not received additional FMLA paperwork to this point I am happy to complete FMLA paperwork given her active Crohn's disease when I receive it Please keep patient informed

## 2016-11-01 NOTE — Telephone Encounter (Signed)
Patient states that she filled out paperwork on Friday, so we should get paperwork shortly. Ms. Donna Richmond also states that she is this is the last week you have her out for [facility] and she will need a work note before her job will let her return. She would like to pick this letter up tomorrow. Letter has been created and placed on Dr Vena Rua desk for review/signature.

## 2016-11-02 ENCOUNTER — Ambulatory Visit (INDEPENDENT_AMBULATORY_CARE_PROVIDER_SITE_OTHER)
Admission: RE | Admit: 2016-11-02 | Discharge: 2016-11-02 | Disposition: A | Discharge status: Home / Self Care | Payer: BLUE CROSS/BLUE SHIELD | Source: Ambulatory Visit | Attending: Internal Medicine | Admitting: Internal Medicine

## 2016-11-02 ENCOUNTER — Telehealth: Payer: Self-pay | Admitting: Internal Medicine

## 2016-11-02 DIAGNOSIS — K50814 Crohn's disease of both small and large intestine with abscess: Secondary | ICD-10-CM

## 2016-11-02 MED ORDER — IOPAMIDOL (ISOVUE-300) INJECTION 61%
100.0000 mL | Freq: Once | INTRAVENOUS | Status: AC | PRN
  Administered 2016-11-02: 100 mL via INTRAVENOUS

## 2016-11-02 NOTE — Telephone Encounter (Signed)
A user error has taken place: ERROR °

## 2016-11-03 ENCOUNTER — Other Ambulatory Visit: Payer: Self-pay

## 2016-11-03 ENCOUNTER — Telehealth: Payer: Self-pay | Admitting: Internal Medicine

## 2016-11-03 MED ORDER — METRONIDAZOLE 250 MG PO TABS: 250.0000 mg | ORAL_TABLET | Freq: Three times a day (TID) | ORAL | 0 refills | Status: DC

## 2016-11-03 MED ORDER — CIPROFLOXACIN HCL 500 MG PO TABS: 500.0000 mg | ORAL_TABLET | Freq: Two times a day (BID) | ORAL | 0 refills | Status: DC

## 2016-11-03 NOTE — Telephone Encounter (Signed)
See result note.  

## 2016-11-03 NOTE — Telephone Encounter (Signed)
Do not see result note.

## 2016-11-08 ENCOUNTER — Telehealth: Payer: Self-pay | Admitting: Internal Medicine

## 2016-11-08 NOTE — Telephone Encounter (Signed)
Revised letter sent.

## 2016-11-08 NOTE — Telephone Encounter (Signed)
Pt states the pharmacy did not have the 20 tablet of cipro that was sent in 11/03/16. Script called to pharmacy. Pt also requests letter stating she is to be out of work through 11/14/16 and return to work on 11/15/16. Letter emailed to pts daughter per her request.

## 2016-11-26 ENCOUNTER — Encounter: Payer: Self-pay | Admitting: Internal Medicine

## 2016-11-26 ENCOUNTER — Ambulatory Visit: Payer: BLUE CROSS/BLUE SHIELD | Admitting: Internal Medicine

## 2016-11-26 ENCOUNTER — Encounter (INDEPENDENT_AMBULATORY_CARE_PROVIDER_SITE_OTHER): Payer: Self-pay

## 2016-11-26 ENCOUNTER — Ambulatory Visit (INDEPENDENT_AMBULATORY_CARE_PROVIDER_SITE_OTHER): Payer: BLUE CROSS/BLUE SHIELD | Admitting: Internal Medicine

## 2016-11-26 ENCOUNTER — Other Ambulatory Visit (INDEPENDENT_AMBULATORY_CARE_PROVIDER_SITE_OTHER): Payer: BLUE CROSS/BLUE SHIELD

## 2016-11-26 VITALS — BP 104/68 | HR 96 | Ht 62.5 in | Wt 155.5 lb

## 2016-11-26 DIAGNOSIS — K50014 Crohn's disease of small intestine with abscess: Secondary | ICD-10-CM

## 2016-11-26 DIAGNOSIS — K50119 Crohn's disease of large intestine with unspecified complications: Secondary | ICD-10-CM

## 2016-11-26 DIAGNOSIS — Z8639 Personal history of other endocrine, nutritional and metabolic disease: Secondary | ICD-10-CM | POA: Diagnosis not present

## 2016-11-26 DIAGNOSIS — D509 Iron deficiency anemia, unspecified: Secondary | ICD-10-CM | POA: Diagnosis not present

## 2016-11-26 DIAGNOSIS — E538 Deficiency of other specified B group vitamins: Secondary | ICD-10-CM

## 2016-11-26 LAB — COMPREHENSIVE METABOLIC PANEL
ALT: 11 U/L (ref 0–35)
AST: 14 U/L (ref 0–37)
Albumin: 3.7 g/dL (ref 3.5–5.2)
Alkaline Phosphatase: 52 U/L (ref 39–117)
BUN: 13 mg/dL (ref 6–23)
CO2: 27 meq/L (ref 19–32)
Calcium: 9.2 mg/dL (ref 8.4–10.5)
Chloride: 100 mEq/L (ref 96–112)
Creatinine, Ser: 0.78 mg/dL (ref 0.40–1.20)
GFR: 79.84 mL/min (ref >60.00)
GLUCOSE: 79 mg/dL (ref 70–99)
POTASSIUM: 3.2 meq/L — AB (ref 3.5–5.1)
SODIUM: 137 meq/L (ref 135–145)
Total Bilirubin: 0.9 mg/dL (ref 0.2–1.2)
Total Protein: 6.8 g/dL (ref 6.0–8.3)

## 2016-11-26 LAB — IBC PANEL
Iron: 46 ug/dL (ref 42–145)
SATURATION RATIOS: 18.1 % — AB (ref 20.0–50.0)
Transferrin: 182 mg/dL — ABNORMAL LOW (ref 212.0–360.0)

## 2016-11-26 LAB — CBC WITH DIFFERENTIAL/PLATELET
BASOS ABS: 0.1 10*3/uL (ref 0.0–0.1)
Basophils Relative: 0.8 % (ref 0.0–3.0)
EOS PCT: 0.9 % (ref 0.0–5.0)
Eosinophils Absolute: 0.1 10*3/uL (ref 0.0–0.7)
HEMATOCRIT: 40.4 % (ref 36.0–46.0)
Hemoglobin: 13.6 g/dL (ref 12.0–15.0)
LYMPHS ABS: 1.5 10*3/uL (ref 0.7–4.0)
LYMPHS PCT: 21.7 % (ref 12.0–46.0)
MCHC: 33.7 g/dL (ref 30.0–36.0)
MCV: 83.8 fl (ref 78.0–100.0)
MONOS PCT: 10.2 % (ref 3.0–12.0)
Monocytes Absolute: 0.7 10*3/uL (ref 0.1–1.0)
NEUTROS ABS: 4.7 10*3/uL (ref 1.4–7.7)
NEUTROS PCT: 66.4 % (ref 43.0–77.0)
Platelets: 327 10*3/uL (ref 150.0–400.0)
RBC: 4.82 Mil/uL (ref 3.87–5.11)
RDW: 16 % — ABNORMAL HIGH (ref 11.5–15.5)
WBC: 7.1 10*3/uL (ref 4.0–10.5)

## 2016-11-26 LAB — VITAMIN B12

## 2016-11-26 LAB — HIGH SENSITIVITY CRP: CRP HIGH SENSITIVITY: 34.06 mg/L — AB (ref 0.000–5.000)

## 2016-11-26 LAB — FERRITIN: FERRITIN: 311.3 ng/mL — AB (ref 10.0–291.0)

## 2016-11-26 NOTE — Patient Instructions (Addendum)
Continue Humira.  Your physician has requested that you go to the basement for the following lab work before leaving today: CBC, CMP, CRP, B12, IBC, Ferritin  You have been scheduled for a follow up with Dr Hilarie Fredrickson on Tuesday, 01/18/17 @ 3:00 pm. _______________________________________________________________________________ Dennis Bast have been scheduled for a CT scan of the abdomen and pelvis at [facility] ([address]---this is in the same building as [facility]).   You are scheduled on 12/06/16 at 4:00 pm. You should arrive 15 minutes prior to your appointment time for registration. Please follow the written instructions below on the day of your exam:  WARNING: IF YOU ARE ALLERGIC TO IODINE/X-RAY DYE, PLEASE NOTIFY RADIOLOGY IMMEDIATELY AT [phone]! YOU WILL BE GIVEN A 13 HOUR PREMEDICATION PREP.  1) Do not eat or drink anything after 12:00 pm (4 hours prior to your test) 2) You have been given 2 bottles of oral contrast to drink. The solution may taste better if refrigerated, but do NOT add ice or any other liquid to this solution. Shake well before drinking.    Drink 1 bottle of contrast @ 2:00 pm (2 hours prior to your exam)  Drink 1 bottle of contrast @ 3:00 pm (1 hour prior to your exam)  You may take any medications as prescribed with a small amount of water except for the following: Metformin, Glucophage, Glucovance, Avandamet, Riomet, Fortamet, Actoplus Met, Janumet, Glumetza or Metaglip. The above medications must be held the day of the exam AND 48 hours after the exam.  The purpose of you drinking the oral contrast is to aid in the visualization of your intestinal tract. The contrast solution may cause some diarrhea. Before your exam is started, you will be given a small amount of fluid to drink. Depending on your individual set of symptoms, you may also receive an intravenous injection of x-ray contrast/dye. Plan on being at [facility] for 30 minutes or  longer, depending on the type of exam you are having performed.  This test typically takes 30-45 minutes to complete.  If you have any questions regarding your exam or if you need to reschedule, you may call the CT department at [phone] between the hours of 8:00 am and 5:00 pm, Monday-Friday.  ________________________________________________________________________  If you are age 48 or older, your body mass index should be between 23-30. Your Body mass index is 27.99 kg/m. If this is out of the aforementioned range listed, please consider follow up with your Primary Care Provider.  If you are age 35 or younger, your body mass index should be between 19-25. Your Body mass index is 27.99 kg/m. If this is out of the aformentioned range listed, please consider follow up with your Primary Care Provider.

## 2016-11-26 NOTE — Progress Notes (Signed)
Subjective:    Patient ID: Donna Richmond, female    DOB: 1955-10-18, 61 y.o.   MRN: [id number]  HPI Donna Richmond is a 61 year old female with a past medical history of ileocolonic Crohn's disease and perianal Crohn's disease who is here for follow-up. She is here today with her husband and was last seen on 10/18/2016.she has been dealing with and being treated for exacerbation and ileal Crohn's complicated by small intra-abdominal abscesses. She has been on antibiotics and has had 2 CT scans the last on January 30. At that time the abscesses were stable to smaller and antibiotics were continued for 4 weeks.  Today she reports that she had been doing very well with improved energy levels resolution of abdominal pain and improving appetite. She returned to work 2 weeks ago and has been working uneventfully. However on Wednesday, 2 days ago she developed nausea, chills, night sweats and mid abdominal discomfort. She did not have vomiting. She was around a granddaughter who got sick with the same symptoms on the same day. She felt bad yesterday and stayed in the bed with poor appetite today has felt better. She did eat a small breakfast and lunch. She's having some mild midabdominal pain but no further chills or night sweats. Bowel movements have been regular somewhat loose but not purely diarrhea or watery. No melena. No blood in stool. Some scant red blood with wiping.she has not had anal pain or drainage.  She is due her next Humira  Injection today. When she got ill on Wednesday she stopped her prednisone and ciprofloxacin and metronidazole. She had only a few antibiotic doses left. She had weaned prednisone down to 10 mg daily. Her taper was to decrease by 5 mg every 7 days and the dose was started 40 mg.   Review of Systems  As per history of present illness, otherwise negative  Current Medications, Allergies, Past Medical History, Past Surgical History, Family History and Social History were  reviewed in [employer] record.      Objective:   Physical Exam BP 104/68   Pulse 96   Ht 5' 2.5" (1.588 m)   Wt 155 lb 8 oz (70.5 kg)   BMI 27.99 kg/m  Constitutional: Well-developed and well-nourished. No distress. HEENT: Normocephalic and atraumatic. Oropharynx is clear and moist. No oropharyngeal exudate. Conjunctivae are normal.  No scleral icterus. Neck: Neck supple. Trachea midline. Cardiovascular: Normal rate, regular rhythm and intact distal pulses. No M/R/G Pulmonary/chest: Effort normal and breath sounds normal. No wheezing, rales or rhonchi. Abdominal: Soft, moderate mid abdominal tenderness at and below the umbilicus without rebound or guarding, nondistended. Bowel sounds active throughout. There are no masses palpable. No hepatosplenomegaly. Extremities: no clubbing, cyanosis, or edema Neurological: Alert and oriented to person place and time. Skin: Skin is warm and dry. No rashes noted. Psychiatric: Normal mood and affect. Behavior is normal.  CBC    Component Value Date/Time   WBC 7.1 11/26/2016 1446   RBC 4.82 11/26/2016 1446   HGB 13.6 11/26/2016 1446   HCT 40.4 11/26/2016 1446   PLT 327.0 11/26/2016 1446   MCV 83.8 11/26/2016 1446   MCHC 33.7 11/26/2016 1446   RDW 16.0 (H) 11/26/2016 1446   LYMPHSABS 1.5 11/26/2016 1446   MONOABS 0.7 11/26/2016 1446   EOSABS 0.1 11/26/2016 1446   BASOSABS 0.1 11/26/2016 1446   CMP     Component Value Date/Time   NA 137 11/26/2016 1454   K 3.2 (L) 11/26/2016  1454   CL 100 11/26/2016 1454   CO2 27 11/26/2016 1454   GLUCOSE 79 11/26/2016 1454   BUN 13 11/26/2016 1454   CREATININE 0.78 11/26/2016 1454   CALCIUM 9.2 11/26/2016 1454   PROT 6.8 11/26/2016 1454   ALBUMIN 3.7 11/26/2016 1454   AST 14 11/26/2016 1454   ALT 11 11/26/2016 1454   ALKPHOS 52 11/26/2016 1454   BILITOT 0.9 11/26/2016 1454   Iron/TIBC/Ferritin/ %Sat    Component Value Date/Time   IRON 46 11/26/2016 1446   FERRITIN  311.3 (H) 11/26/2016 1446   IRONPCTSAT 18.1 (L) 11/26/2016 1446   CLINICAL DATA:  61 year old female with Crohn's disease on antibiotics and prednisone. Persistent left lower quadrant tenderness. Three week follow-up of abscess. Subsequent encounter.   EXAM: CT ABDOMEN AND PELVIS WITH CONTRAST   TECHNIQUE: Multidetector CT imaging of the abdomen and pelvis was performed using the standard protocol following bolus administration of intravenous contrast.   CONTRAST:  123m ISOVUE-300 IOPAMIDOL (ISOVUE-300) INJECTION 61%   COMPARISON:  10/22/2016 CT.   FINDINGS: Lower chest: Lung bases clear. Heart size top-normal. Trace pericardial fluid.   Hepatobiliary: No worrisome hepatic lesion. No calcified gallstones.   Pancreas: No mass or inflammation.   Spleen: No mass or enlargement.   Adrenals/Urinary Tract: No hydrocephalus. No worrisome renal or adrenal lesion. There may be tiny renal cysts.   Stomach/Bowel: Diffuse inflammation of the terminal ileum consistent with patient's history of Crohn's disease (lymphoma could have a similar appearance by CT but does not appear to be case clinically). Small abscess medial aspect measures up to 2.8 cm containing small amount of fluid and gas and better defined than on the prior exam. Intramural abscess lateral aspect measures up to 9 mm. Stranding of adjacent fat planes.   No new area of inflammation separate from this region.   Small stone within the appendix without inflammation centered at this level.   Vascular/Lymphatic: Small lymph nodes right lower quadrant adjacent to the inflammation.   Atherosclerotic changes abdominal aorta without large vessel occlusion.   Reproductive: Prolapsing uterus.  No adnexal mass.   Other: No free intraperitoneal air.   Musculoskeletal: Degenerative changes L2-3, L4-5 and L5-S1.   IMPRESSION: Diffuse inflammation of the terminal ileum consistent with patient's history of Crohn's  disease. Small abscess medial aspect measures up to 2.8 cm containing small amount of fluid and gas and better defined than on the prior exam. Intramural abscess lateral aspect measures up to 9 mm. Stranding of adjacent fat planes.   No new area of inflammation separate from this region.   Small stone within the appendix without inflammation centered at this level.     Electronically Signed   By: SGenia DelM.D.   On: 11/02/2016 14:03   CT ABDOMEN AND PELVIS WITH CONTRAST   TECHNIQUE: Multidetector CT imaging of the abdomen and pelvis was performed using the standard protocol following bolus administration of intravenous contrast.   CONTRAST:  1021mISOVUE-300 IOPAMIDOL (ISOVUE-300) INJECTION 61%   COMPARISON:  MRI 02/11/2016   FINDINGS: Lower chest: The lung bases are clear of acute process. No pleural effusion or pulmonary lesions. The heart is normal in size. Small amount of pericardial fluid without overt effusion. The distal esophagus and aorta are unremarkable. The   Hepatobiliary: No focal hepatic lesions or intrahepatic biliary dilatation. The gallbladder is normal. No common bile duct dilatation.   Pancreas: No mass, inflammation or ductal dilatation.   Spleen: Normal size.  No focal lesions.  Adrenals/Urinary Tract: The adrenal glands and kidneys are unremarkable.   Stomach/Bowel: The stomach, duodenum and proximal and mid small bowel are unremarkable. No inflammatory changes or obstructive findings. There is severe inflammation of the distal and terminal ileum consistent with active Crohn's disease. The terminal ileum demonstrates some fibrofatty changes in the wall consistent with acute on chronic Crohn's disease. There are 2 areas suspicious for small walled-off perforations involving the distal ileum. The more superior and medial collection measures 3 cm. The second area is more inferior and lateral and measures 2.5 cm. This could be intramural  abscess. The   No acute cecal inflammation. The cecal tip demonstrates fibrofatty changes and could be due to chronic inflammation. The appendix appears normal except for small appendicoliths.   Vascular/Lymphatic: Numerous small pericecal hyperplastic lymph nodes.   Age advanced atherosclerotic calcifications involving the aorta but no aneurysm or dissection. The branch vessels are patent. The major venous structures are patent.   Reproductive: The uterus and ovaries are normal.   Other: No pelvic mass or adenopathy. No free pelvic fluid collections. No inguinal mass or adenopathy. No abdominal wall hernia or subcutaneous lesions.   Musculoskeletal: No significant bony findings.   IMPRESSION: CT findings consistent with active Crohn's disease with severe inflammation of the distal and terminal ileum. Small focal walled-off perforations are suggested as discussed above.   No mechanical or functional obstruction.  The colon appears normal.   Suspect appendiceal appendicoliths but no acute inflammation.     Electronically Signed   By: Marijo Sanes M.D.   On: 10/12/2016 20:01   MR ABDOMEN AND PELVIS WITHOUT AND WITH CONTRAST (MR ENTEROGRAPHY)   TECHNIQUE: Multiplanar, multisequence MRI of the abdomen and pelvis was performed both before and during bolus administration of intravenous contrast. Negative oral contrast VoLumen was given.   CONTRAST:  79m MULTIHANCE GADOBENATE DIMEGLUMINE 529 MG/ML IV SOLN   COMPARISON:  None.   FINDINGS: COMBINED FINDINGS FOR BOTH MR ABDOMEN AND PELVIS   There is marked wall thickening, inflammation and enhancement of the distal and terminal ileum along with surrounding inflammation in the perienteric fat. Findings consistent with active Crohn's disease. No complicating features such as abscess or fistula. The cecum and right colon are unremarkable. The appendix is normal.   No obvious rectal or anorectal disease is identified.     The liver and spleen are unremarkable. The gallbladder is normal. No common bile duct dilatation. The pancreas is normal. The adrenal glands and kidneys are normal.   No mesenteric or retroperitoneal mass or adenopathy. The aorta and branch vessels are normal. The major venous structures are patent. Small right-sided inflammatory ileocolic lymph nodes are noted.   The stomach, duodenum, remaining small bowel and colon are unremarkable.   IMPRESSION: MR findings consistent with acute/ active Crohn's disease involving the distal and terminal ileum. No findings for colonic involvement. The rectum and anus appear normal.     Electronically Signed   By: PMarijo SanesM.D.   On: 02/11/2016 16:11 MRI PELVIS WITHOUT AND WITH CONTRAST   TECHNIQUE: Multiplanar multisequence MR imaging of the pelvis was performed both before and after administration of intravenous contrast.   CONTRAST:  154mMULTIHANCE GADOBENATE DIMEGLUMINE 529 MG/ML IV SOLN   COMPARISON:  02/11/2016 MRI enterography study.   FINDINGS: Uterus: The normal size anteverted uterus measures 6.5 x 3.4 x 4.6 cm. No uterine fibroids. Inner myometrium (junctional zone) measures 7 mm in thickness, which is within normal limits. Endometrium measures to mm in  bilayer thickness, which is within normal limits. No endometrial cavity fluid or focal endometrial mass.   Ovaries and Adnexa: The right ovary measures 1.7 x 1.0 x 0.7 cm and is normal. The left ovary measures 1.6 x 1.2 x 1.1 cm and is normal. There is a simple 1.7 x 1.1 x 1.1 cm paraovarian/paratubal left adnexal cyst. There are no suspicious ovarian or adnexal masses.   Bladder: Normal.  Normal visualized urethra.   Bowel: There is a simple linear intersphincteric perianal fistula between 5:00 and 6:00 extending inferiorly to the medial left gluteal cleft with enhancing margins and surrounding edema in the medial left greater than right gluteal clefts (series  8/image 25 and series 9/ images 25 and 26). No additional perianal fistulas. No discrete abscess. There is a wall of distal ileum in the right deep pelvis with circumferential wall thickening and wall hyperenhancement, consistent with active Crohn ileitis, as demonstrated on the recent MRI enterography. No dilated bowel loops.   Vascular/Lymphatic: No pathologically enlarged lymph nodes in the pelvis.   Other: Small volume free fluid in the deep pelvis. No focal pelvic fluid collection.   Musculoskeletal: No aggressive appearing focal osseous lesions.   IMPRESSION: 1. Simple linear intersphincteric left perianal fistula between 5:00 and 6:00 extending inferiorly to the medial left gluteal cleft. No abscess. 2. Active Crohn ileitis in the distal ileum in the right pelvis, as demonstrated on the MR enterography study from 2 days prior. Small volume free fluid in the deep pelvis is likely reactive . 3. Small simple paraovarian/paratubal left adnexal cyst. Normal ovaries. No suspicious adnexal masses.     Electronically Signed   By: Ilona Sorrel M.D.   On: 02/13/2016 09:45      Assessment & Plan:  61 year old female with a past medical history of ileocolonic Crohn's disease and perianal Crohn's disease who is here for follow-up  1. Ileal Crohn's disease with abscess/perianal Crohn's -- she is clinically better and symptoms over the last 2 days may have been related to a viral enteritis contracted from her granddaughter. With her history I have let her know I cannot be 100% sure this does not represent an exacerbation of Crohn's disease. She has completed nearly 4 weeks of additional antibiotic therapy for the small abscesses associated with her ileal Crohn's. She would like to not resume these of possible. She also has completed her prednisone taper. --Continue Humira 40 mg daily --Repeat CT scan of the abdomen and pelvis with contrast in 7-10 daysto reassess Crohn's disease and  ensure abscesses have improved further and hopefully resolved --Remain off prednisone and antibiotics for now. --If abscesses remain she will need further antibiotics and consideration of surgery if worsening  2. B12 deficiency -- continue oral B12 daily  3. IDA -- now normal hemoglobin and MCV. Ferritin is elevated likely as an acute phase reactant. Does not need oral iron or IV iron at present. We will follow blood counts and iron stores going forward  30 minutes spent with the patient today. Greater than 50% was spent in counseling and coordination of care with the patient

## 2016-11-27 ENCOUNTER — Telehealth: Payer: Self-pay | Admitting: Gastroenterology

## 2016-11-27 ENCOUNTER — Encounter: Payer: Self-pay | Admitting: Internal Medicine

## 2016-11-27 NOTE — Telephone Encounter (Signed)
This patient's daughter called me 4pm Saturday b/c Nalah feeling worse with abdominal pain and chills. She decided to take 40 mg of prednisone today and felt somewhat better.  I read your 2/23 office note.   My concern was worsening infection/abscess, and advised ED today.  They wished to avoid that if possible, so I recommended no more prednisone and instead resume ciprofloxacin 500 mg twice daily and metronidazole 500 mg three times daily.  She had several days' supply of both at home.  Advised that if not improved by mid day tomorrow, should proceed to ED.

## 2016-11-29 ENCOUNTER — Encounter (HOSPITAL_COMMUNITY): Payer: Self-pay | Admitting: Emergency Medicine

## 2016-11-29 ENCOUNTER — Inpatient Hospital Stay (HOSPITAL_COMMUNITY)
Admission: EM | Admit: 2016-11-29 | Discharge: 2016-12-02 | DRG: 386 | Disposition: A | Discharge status: Home / Self Care | Payer: BLUE CROSS/BLUE SHIELD | Attending: Internal Medicine | Admitting: Internal Medicine

## 2016-11-29 ENCOUNTER — Emergency Department (HOSPITAL_COMMUNITY): Payer: BLUE CROSS/BLUE SHIELD

## 2016-11-29 DIAGNOSIS — R3129 Other microscopic hematuria: Secondary | ICD-10-CM | POA: Diagnosis present

## 2016-11-29 DIAGNOSIS — E440 Moderate protein-calorie malnutrition: Secondary | ICD-10-CM | POA: Diagnosis present

## 2016-11-29 DIAGNOSIS — D509 Iron deficiency anemia, unspecified: Secondary | ICD-10-CM | POA: Diagnosis present

## 2016-11-29 DIAGNOSIS — E538 Deficiency of other specified B group vitamins: Secondary | ICD-10-CM | POA: Diagnosis present

## 2016-11-29 DIAGNOSIS — I10 Essential (primary) hypertension: Secondary | ICD-10-CM | POA: Diagnosis present

## 2016-11-29 DIAGNOSIS — K50014 Crohn's disease of small intestine with abscess: Secondary | ICD-10-CM

## 2016-11-29 DIAGNOSIS — Z79899 Other long term (current) drug therapy: Secondary | ICD-10-CM | POA: Diagnosis not present

## 2016-11-29 DIAGNOSIS — R1033 Periumbilical pain: Secondary | ICD-10-CM | POA: Diagnosis not present

## 2016-11-29 DIAGNOSIS — N179 Acute kidney failure, unspecified: Secondary | ICD-10-CM | POA: Diagnosis present

## 2016-11-29 DIAGNOSIS — K5080 Crohn's disease of both small and large intestine without complications: Secondary | ICD-10-CM | POA: Diagnosis not present

## 2016-11-29 DIAGNOSIS — E876 Hypokalemia: Secondary | ICD-10-CM

## 2016-11-29 DIAGNOSIS — K50913 Crohn's disease, unspecified, with fistula: Secondary | ICD-10-CM | POA: Diagnosis not present

## 2016-11-29 DIAGNOSIS — K50013 Crohn's disease of small intestine with fistula: Principal | ICD-10-CM | POA: Diagnosis present

## 2016-11-29 LAB — CBC
HCT: 39.1 % (ref 36.0–46.0)
Hemoglobin: 12.7 g/dL (ref 12.0–15.0)
MCH: 28 pg (ref 26.0–34.0)
MCHC: 32.5 g/dL (ref 30.0–36.0)
MCV: 86.3 fL (ref 78.0–100.0)
Platelets: 254 10*3/uL (ref 150–400)
RBC: 4.53 MIL/uL (ref 3.87–5.11)
RDW: 15.6 % — AB (ref 11.5–15.5)
WBC: 6.8 10*3/uL (ref 4.0–10.5)

## 2016-11-29 LAB — COMPREHENSIVE METABOLIC PANEL
ALBUMIN: 3.4 g/dL — AB (ref 3.5–5.0)
ALK PHOS: 49 U/L (ref 38–126)
ALT: 13 U/L — AB (ref 14–54)
AST: 17 U/L (ref 15–41)
Anion gap: 12 (ref 5–15)
BUN: 14 mg/dL (ref 6–20)
CALCIUM: 9.3 mg/dL (ref 8.9–10.3)
CO2: 26 mmol/L (ref 22–32)
Chloride: 99 mmol/L — ABNORMAL LOW (ref 101–111)
Creatinine, Ser: 1.17 mg/dL — ABNORMAL HIGH (ref 0.44–1.00)
GFR calc Af Amer: 57 mL/min — ABNORMAL LOW (ref >60)
GFR calc non Af Amer: 50 mL/min — ABNORMAL LOW (ref >60)
GLUCOSE: 103 mg/dL — AB (ref 65–99)
Potassium: 3 mmol/L — ABNORMAL LOW (ref 3.5–5.1)
Sodium: 137 mmol/L (ref 135–145)
TOTAL PROTEIN: 6.6 g/dL (ref 6.5–8.1)
Total Bilirubin: 0.9 mg/dL (ref 0.3–1.2)

## 2016-11-29 LAB — LIPASE, BLOOD: Lipase: 14 U/L (ref 11–51)

## 2016-11-29 LAB — URINALYSIS, ROUTINE W REFLEX MICROSCOPIC
BACTERIA UA: NONE SEEN
Bilirubin Urine: NEGATIVE
Glucose, UA: NEGATIVE mg/dL
Ketones, ur: NEGATIVE mg/dL
NITRITE: NEGATIVE
PROTEIN: NEGATIVE mg/dL
SPECIFIC GRAVITY, URINE: 1.012 (ref 1.005–1.030)
pH: 5 (ref 5.0–8.0)

## 2016-11-29 MED ORDER — IOPAMIDOL (ISOVUE-300) INJECTION 61%
INTRAVENOUS | Status: AC
  Administered 2016-11-29: 100 mL
  Filled 2016-11-29: qty 100

## 2016-11-29 MED ORDER — SODIUM CHLORIDE 0.9% FLUSH: 3.0000 mL | Freq: Two times a day (BID) | INTRAVENOUS | Status: DC

## 2016-11-29 MED ORDER — POTASSIUM CHLORIDE IN NACL 40-0.9 MEQ/L-% IV SOLN
INTRAVENOUS | Status: DC
  Administered 2016-11-29 – 2016-11-30 (×2): 100 mL/h via INTRAVENOUS
  Filled 2016-11-29 (×3): qty 1000

## 2016-11-29 MED ORDER — METHYLPREDNISOLONE SODIUM SUCC 40 MG IJ SOLR
20.0000 mg | Freq: Once | INTRAMUSCULAR | Status: AC
  Administered 2016-11-30: 20 mg via INTRAVENOUS
  Filled 2016-11-29: qty 1

## 2016-11-29 MED ORDER — SODIUM CHLORIDE 0.9 % IV BOLUS (SEPSIS)
1000.0000 mL | Freq: Once | INTRAVENOUS | Status: AC
  Administered 2016-11-29: 1000 mL via INTRAVENOUS

## 2016-11-29 MED ORDER — ENOXAPARIN SODIUM 40 MG/0.4ML ~~LOC~~ SOLN
40.0000 mg | SUBCUTANEOUS | Status: DC
  Administered 2016-11-29 – 2016-12-01 (×3): 40 mg via SUBCUTANEOUS
  Filled 2016-11-29 (×2): qty 0.4

## 2016-11-29 MED ORDER — SODIUM CHLORIDE 0.9% FLUSH: 3.0000 mL | INTRAVENOUS | Status: DC | PRN

## 2016-11-29 MED ORDER — ONDANSETRON HCL 4 MG/2ML IJ SOLN: 4.0000 mg | Freq: Four times a day (QID) | INTRAMUSCULAR | Status: DC | PRN

## 2016-11-29 MED ORDER — ONDANSETRON HCL 4 MG/2ML IJ SOLN
4.0000 mg | Freq: Once | INTRAMUSCULAR | Status: AC
  Administered 2016-11-29: 4 mg via INTRAVENOUS
  Filled 2016-11-29: qty 2

## 2016-11-29 MED ORDER — ONDANSETRON HCL 4 MG PO TABS: 4.0000 mg | ORAL_TABLET | Freq: Four times a day (QID) | ORAL | Status: DC | PRN

## 2016-11-29 MED ORDER — METHYLPREDNISOLONE SODIUM SUCC 40 MG IJ SOLR
20.0000 mg | Freq: Once | INTRAMUSCULAR | Status: AC
  Administered 2016-11-29: 20 mg via INTRAVENOUS
  Filled 2016-11-29: qty 1

## 2016-11-29 MED ORDER — SODIUM CHLORIDE 0.9 % IV SOLN: 250.0000 mL | INTRAVENOUS | Status: DC | PRN

## 2016-11-29 MED ORDER — POTASSIUM CHLORIDE 2 MEQ/ML IV SOLN
30.0000 meq | Freq: Once | INTRAVENOUS | Status: AC
  Administered 2016-11-29: 30 meq via INTRAVENOUS
  Filled 2016-11-29: qty 15

## 2016-11-29 NOTE — Consult Note (Signed)
[facility] Consult: 1:19 PM 11/29/2016  LOS: 0 days    Referring Provider: Dr Stark Jock in ED  Primary Care Physician:  Marco Collie, MD Primary Gastroenterologist:  Dr. Hilarie Fredrickson     Reason for Consultation:  Abdominal pain.     HPI: Donna Richmond is a 61 y.o. female.  PMH ileo-colonic Crohn's dz, diagnosed ~ 2013.  Hx C diff 08/23/2016, treated with Flagyl x 10 days and prednisone taper.  HTN. B12 deficiency on oral replacement.   Last Colonoscopy 08/01/2015: with Dr Lyndel Safe in [place] showed terminal ileal stricture with surrounding ulcerations and erythema. Ulcerations also involving the ileocecal valve but not the cecum. The majority of the colon was normal but there were a few aphthous erosions in the rectum within 5 cm of the anal verge. Thickening in the anorectum felt to be scar formation which was also biopsied. Pathology revealed chronic active ileitis with ulceration and villous atrophy from the terminal ileum. In the rectum was chronic active proctitis with ulceration. In the anorectum there was anorectal mucosa with mild chronic active inflammation and foci of superficial mucosal erosion. In past, pt unable to afford Apriso or Lialda.    Developed small intra-abdominal abscesses in early 10/2015.  CT of 10/12/16: active Crohn's severe inflammation of the distal and terminal ileum. Small focal walled-off perforations are suggested as discussed above.  Started on oral Cipro and Flagyl along with Prednisone taper which continued through 11/25/16. CT on 11/02/16: Ileal Crohn's. 2.8 x 9 cm abscess improved.  CRP of 2/23: 34.  B12 >1500    Meds include weekly Humira, which started ~ 12/292017 last dosed on 2/23.  She takes her B12 pills and her iron was discontinued > 69monthago.   At GI office follow up with  Dr PHilarie Fredricksonon 11/26/16 she reported 2 days on nausea, chills, night sweats, mid-abdominal pain.  Stools loose, and some scant blood within.  A grandchild had had recent acute GI illness with similar sxs.  Stopped Prednisone (had been weaned slowly from 40 to 10 mg daily), Cipro, Flagyl on 2/21.  She had nearly completed the abx and was close to stopping these and Prednisoone.   Dr PVena Ruaplan was to continue with weekly Humira and to undergo CT scan in 7 to 10 days.    Called office over weekend , 2/24 with worsening abd pain, ongoing chills.  Took 40 mg Prednisone on 2/24 with some relief.  Dr DLoletha Carrowspoke with pt on 2/24 and advised no more prednisone and to start oral Cipro 500 mg BID and Metronidazole 500 mg TID.  Pt has complied with this but still feels poorly.  Pain is not severe but persists in mid abdomen, not radiating.  Abx makes her feel sick. No vomiting but dry heaves started yesterday.  ~ 3 soft, loose brown BMs per day, sees blood with wiping.  Highest temps at home 99.4.  Advised to go to ED.    CT scan today:   1. Persistent extensive inflammatory changes involving the terminal ileum consistent with active Crohn's disease.  Small extraluminal tubular fluid collection may reflect a developing entero-entero  istula.  2. No evidence of obstruction perforation.  3. Appendicolith without evidence of acute appendiceal inflammation. Labs today: WBCs 6.8.  Hgb 12.7.  K 3.0.  Mild increase creatinine. Treated with IV Potassium, Solumedrol 40 mg, Zofran.    Past Medical History:  Diagnosis Date  . B12 deficiency   . Crohn's colitis ([place])   . Hypertension   . IDA (iron deficiency anemia)   . Proctitis   . Ulcerative ileocolitis ([place])     Past Surgical History:  Procedure Laterality Date  . DILATION AND CURETTAGE OF UTERUS    . TUBAL LIGATION      Prior to Admission medications   Medication Sig Start Date End Date Taking? Authorizing Provider  Fe Fum-FePoly-Vit C-Vit B3 (INTEGRA)  62.5-62.5-40-3 MG CAPS Take 1 capsule by mouth daily. Pt not taking as of 10/2016.    Yes Historical Provider, MD  metroNIDAZOLE (FLAGYL) 500 MG tablet Take 500 mg by mouth 3 (three) times daily.   Yes Historical Provider, MD  Adalimumab (HUMIRA PEN) 40 MG/0.8ML PNKT Inject 40 mg into the skin every 14 (fourteen) days. 09/23/16   Jerene Bears, MD  Olmesartan-Amlodipine-HCTZ 40-5-12.5 MG TABS Take 1 capsule by mouth daily.    Historical Provider, MD  vitamin B-12 (CYANOCOBALAMIN) 1000 MCG tablet Take 1,000 mcg by mouth daily.    Historical Provider, MD    Scheduled Meds:  Infusions:  PRN Meds:    Allergies as of 11/29/2016 - Review Complete 11/29/2016  Allergen Reaction Noted  . Biaxin [clarithromycin] Hives 02/03/2016    Family History  Problem Relation Age of Onset  . Colon cancer Neg Hx     Social History   Social History  . Marital status: Married    Spouse name: Biomedical scientist  . Number of children: 2  . Years of education: N/A   Occupational History  . [employer]     Social History Main Topics  . Smoking status: Never Smoker  . Smokeless tobacco: Never Used  . Alcohol use No  . Drug use: No  . Sexual activity: Not on file   Other Topics Concern  . Not on file   Social History Narrative  . No narrative on file    REVIEW OF SYSTEMS: Constitutional:  Feels worn out.  No falls ENT:  No nose bleeds Pulm:  No SOB or cough CV:  No palpitations, no LE edema.  GU:  No hematuria, no frequency GI:  Per HPI.  No heartburn or dysphagia Heme:  No unusual or excessive bleeding or bruising.     Transfusions:  none Neuro:  No headaches, no peripheral tingling or numbness Derm:  Developed sores on right upper lip and tip of right nostril over weekend.  No LE sores   Endocrine:  No sweats or chills.  No polyuria or dysuria Immunization:  Vaccinated for Hep b x 3 in 2017.  Travel:  None beyond local counties in last few months.    PHYSICAL EXAM: Vital signs in last  24 hours: Vitals:   11/29/16 1215 11/29/16 1230  BP: (!) 109/50 112/57  Pulse: 69 71  Resp:  16  Temp:     Wt Readings from Last 3 Encounters:  11/26/16 70.5 kg (155 lb 8 oz)  09/22/16 73.9 kg (163 lb)  08/20/16 73.9 kg (163 lb)    General: pleasant, comfortable Head:  No asymmetry or swelling  Eyes:  No icterus or pallor  Ears:  Not HOH  Nose:  Slight patch of redness at tip of right nostril Mouth:  Similar patch of redness just superior to upper right lip Neck:  No mass, no JVD, no TMG Lungs:  Clear bil.  No dyspnea or cough Heart: RRR.  No mrg, S1/S2 present Abdomen:  Soft, BS hypoactive.  No tinkling or tympanitic BS.  No mass or HSM.  Localized tenderness without guarding or rebound in mid and RLQ.  Marland Kitchen   Rectal: deferred   Musc/Skeltl: no joint redness, swelling or deformity Extremities:  No CCE  Neurologic:  Oriented x 3.  No tremor, limb weakness or gross deficits. Skin:  Prominent vascular pattern on face (? Rosacea) Tattoos:  none Nodes:  No cervical adenopathy   Psych:  Cooperative, calm, pleasant.   Intake/Output from previous day: No intake/output data recorded. Intake/Output this shift: No intake/output data recorded.  LAB RESULTS:  Recent Labs  11/26/16 1446 11/29/16 1109  WBC 7.1 6.8  HGB 13.6 12.7  HCT 40.4 39.1  PLT 327.0 254   BMET Lab Results  Component Value Date   NA 137 11/29/2016   NA 137 11/26/2016   NA 137 10/11/2016   K 3.0 (L) 11/29/2016   K 3.2 (L) 11/26/2016   K 3.9 10/11/2016   CL 99 (L) 11/29/2016   CL 100 11/26/2016   CL 101 10/11/2016   CO2 26 11/29/2016   CO2 27 11/26/2016   CO2 28 10/11/2016   GLUCOSE 103 (H) 11/29/2016   GLUCOSE 79 11/26/2016   GLUCOSE 106 (H) 10/11/2016   BUN 14 11/29/2016   BUN 13 11/26/2016   BUN 15 10/11/2016   CREATININE 1.17 (H) 11/29/2016   CREATININE 0.78 11/26/2016   CREATININE 0.73 10/11/2016   CALCIUM 9.3 11/29/2016   CALCIUM 9.2 11/26/2016   CALCIUM 9.3 10/11/2016    LFT  Recent Labs  11/26/16 1454 11/29/16 1109  PROT 6.8 6.6  ALBUMIN 3.7 3.4*  AST 14 17  ALT 11 13*  ALKPHOS 52 49  BILITOT 0.9 0.9   PT/INR No results found for: INR, PROTIME Hepatitis Panel No results for input(s): HEPBSAG, HCVAB, HEPAIGM, HEPBIGM in the last 72 hours. C-Diff No components found for: CDIFF Lipase     Component Value Date/Time   LIPASE 14 11/29/2016 1109    Drugs of Abuse  No results found for: LABOPIA, COCAINSCRNUR, LABBENZ, AMPHETMU, THCU, LABBARB   RADIOLOGY STUDIES: Ct Abdomen Pelvis W Contrast  Result Date: 11/29/2016 CLINICAL DATA:  Abdominal pain with nausea and vomiting. History of Crohn's disease. EXAM: CT ABDOMEN AND PELVIS WITH CONTRAST TECHNIQUE: Multidetector CT imaging of the abdomen and pelvis was performed using the standard protocol following bolus administration of intravenous contrast. CONTRAST:  134m ISOVUE-300 IOPAMIDOL (ISOVUE-300) INJECTION 61% COMPARISON:  CT 11/02/2016 and 10/12/2016. FINDINGS: Lower chest: Clear lung bases. Small pericardial effusion appears unchanged. There is no pleural effusion. Hepatobiliary: The liver appears normal. No evidence of gallstones, gallbladder wall thickening or biliary dilatation. Pancreas: Unremarkable. No pancreatic ductal dilatation or surrounding inflammatory changes. Spleen: Normal in size without focal abnormality. Adrenals/Urinary Tract: Both adrenal glands appear normal. There are stable tiny low-density renal lesions bilaterally, likely cysts. No evidence of enhancing renal mass, urinary tract calculus or hydronephrosis. The bladder appears normal. Stomach/Bowel: There are persistent extensive inflammatory changes involving the terminal ileum. There is associated wall thickening and surrounding inflammation. A small tubular fluid and air collection extends medially, potentially a small entero entero fistula. The cecum appears normal. No colonic  inflammatory changes are identified. There is no  evidence of acute appendiceal inflammation. There is a small appendicolith which appears unchanged. Vascular/Lymphatic: Stable small reactive lymph nodes in the ileocolonic mesentery. There is aortic and branch vessel atherosclerosis. No acute vascular findings are seen. Reproductive: The uterus and ovaries appear normal. Other: Stable small umbilical hernia containing only fat. No ascites or free air. Musculoskeletal: No acute or significant osseous findings. No evidence of sacroiliitis. IMPRESSION: 1. Persistent extensive inflammatory changes involving the terminal ileum consistent with active Crohn's disease. Small extraluminal tubular fluid collection may reflect a developing entero entero fistula. 2. No evidence of obstruction perforation. 3. Appendicolith without evidence of acute appendiceal inflammation. Electronically Signed   By: Richardean Sale M.D.   On: 11/29/2016 13:10      IMPRESSION:   *  Complicated Crohn's ileo-colitis. 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  On going Humira q week.    *  Hypokalemia.  Treated with IV supplement per ED doc.   *  AKI.  Mild   PLAN:     *  Gen surgical consult ?   [name]  11/29/2016, 1:19 PM Pager: [phone]

## 2016-11-29 NOTE — ED Notes (Signed)
GI at bedside

## 2016-11-29 NOTE — Telephone Encounter (Signed)
Mr. Gullickson called back to say his wife "had a terrible night" and that "she is sick" Low grade fever Abd pain I advised they go to ED for stat CT scan and very very likely admission for active Crohn's complicated by abscess I expect she will need IV abx and eventual surgery He voiced understanding and plan to bring her to [facility]

## 2016-11-29 NOTE — H&P (Signed)
Date: 11/29/2016               Patient Name:  Donna Richmond MRN: [id number]  DOB: 05-31-1956 Age / Sex: 61 y.o., female   PCP: Marco Collie, MD         Medical Service: Internal Medicine Teaching Service         Attending Physician: Dr. Veryl Speak, MD    First Contact: Dr. Velna Ochs Pager: [phone]  Second Contact: Dr. Maryellen Pile Pager: [phone]       After Hours (After 5p/  First Contact Pager: [phone]  weekends / holidays): Second Contact Pager: [phone]   Chief Complaint: Abdominal pain   History of Present Illness: Patient is a 61 yo F with pmhx of ileocolonic and perianal Crohn's disease for which she is taking humira (started 10/01/2016 - last dose 11/26/16). She has history of multiple small intra-abdominal abscesses that were first noted in January of 2017. She recently completed a 4 week course of cipro and flagyl along with a prednisone taper on 11/25/16. She was seen in clinic by Dr. Hilarie Fredrickson (GI) on 11/26/16. At that time, patient had been recovering from what she thought was a viral enteritis she contracted from her granddaughter. She reported 2 days of nausea, chills, night-sweats, and abdominal pain that she felt were improving. Dr. Hilarie Fredrickson counseled patient that given her history, her symptoms could represent an exacerbation of her Crohn's disease rather than a viral infection. However, since she was improving clinically and resistant to the idea of restarting antibiotics, Dr. Hilarie Fredrickson opted to continue humira for the time being and repeat CT in 7-10 days. Unfortunately over the weekend she developed worsening abdominal pain and recurrent chills. She called Dr. Vena Rua office and was instructed to take 40 mg of prednisone and told to start taking oral Cipro 500 mg and metronidazole 500 mg TID on 2/24. Today, patient's husband called the office back to say his wife was still doing poorly and now had a low grade fever. Patient was advised to go to the ED for evaluation  and repeat CT scan.  On arrival to the ED, patient was afebrile T 97.8 and hemodynamically stable BP 104/68, HR 96, RR 16, oxygen 100% on RA. CBC was unremarkable (wbc of 6.8). BMP notable for hypokalemia (3.0) and AKI (creatinine 1.17 from baseline 0.7). UA showed microscopic hematuria, small leukocytes, and no bacteria. Lipase within normal limits. CT abdomen and pelvis noted persistent extensive inflammatory changes involving the terminal ileum consistent with active crohn's disease. Small extraluminal tubular fluid collection was noted that may reflect a developing entero-entero fistula. Otherwise there was no evidence of obstruction or perforation. Appendicolith was also noted without evidence of acute appendiceal inflammation.  Meds:  Current Meds  Medication Sig  . Fe Fum-FePoly-Vit C-Vit B3 (INTEGRA) 62.5-62.5-40-3 MG CAPS Take 1 capsule by mouth daily.  . metroNIDAZOLE (FLAGYL) 500 MG tablet Take 500 mg by mouth 3 (three) times daily.     Allergies: Allergies as of 11/29/2016 - Review Complete 11/29/2016  Allergen Reaction Noted  . Biaxin [clarithromycin] Hives 02/03/2016   Past Medical History:  Diagnosis Date  . B12 deficiency   . Crohn's colitis ([place])   . Hypertension   . IDA (iron deficiency anemia)   . Proctitis   . Ulcerative ileocolitis ([place])     Family History: None known to patient (most family members deceased or private about their health). No known family history of crohn's or UC.   Social  History: Lives with husband. Denies tobacco, alcohol, and illicit drug use.   Review of Systems: A complete ROS was negative except as per HPI.   Physical Exam: Blood pressure 112/57, pulse 71, temperature 97.8 F (36.6 C), temperature source Oral, resp. rate 16, SpO2 100 %. Constitutional: NAD, appears comfortable HEENT: Atraumatic, normocephalic. PERRL, anicteric sclera.  Neck: Supple, trachea midline.  Cardiovascular: RRR, no murmurs, rubs, or gallops.    Pulmonary/Chest: CTAB, no wheezes, rales, or rhonchi.   Abdominal: Soft, non tender, non distended. +BS. Tender to palpation over suprapubic region. No rebound or guarding.  Extremities: Warm and well perfused. Distal pulses intact. No edema.  Neurological: A&Ox3, CN II - XII grossly intact.  Skin: No rashes or erythema  Psychiatric: Normal mood and affect  CT Abdomen/Pelvis:  IMPRESSION: 1. Persistent extensive inflammatory changes involving the terminal ileum consistent with active Crohn's disease. Small extraluminal tubular fluid collection may reflect a developing entero entero fistula. 2. No evidence of obstruction perforation. 3. Appendicolith without evidence of acute appendiceal inflammation.   Assessment & Plan by Problem:  Patient is a 61 yo F with a pmhx of ileocolonic and perianal Crohn's disease complicated by intraabdominal abscesses who presented with worsening nausea and abdominal pain, now with imaging concerning for a developing entero-enteric fistula.   Crohn's Ileocolitis: Complicated by intraabdominal abscesses now with CT concerning for new entero-enteric fistula. Patient recently completed a long course of antibiotics that were started in January of 2018. She is currently afebrile without signs of systemic infection (no leukocytosis, hemodynamically stable). GI is following and considering a surgical consultation. We appreciate their guidance in her care.  -- GI following, appreciate recs  -- Hold off on antibiotics pending GI recommendations  -- S/p solumedrol 20 mg x 1 with improvement in symptoms; plan to repeat in 12 hours  -- Zofran prn for nausea  -- Humira q weekly per GI  -- Patient declined pain medication   Hyokalemia: -- Releting prn  -- Repeat AM BMP   AKI: Creatinine elevated 1.17 from baseline of 0.7. Likely pre-renal from poor po intake.  -- S/p 2 L NS bolus in ED  -- NS @ 100  -- Repeat AM BMP  -- Hold home antihypertensives   HTN:  Normotensive on admission. Patient takes olmesartan-amlodipine-hctz 40-5-12.5 mg at home. Held on admission due to AKI. -- Monitor vitals  -- Plan to restart once AKI improved   FEN: NS @ 100, replete lytes prn, NPO  VTE ppx: Lovenox  Code Status: FULL   Dispo: Admit patient to Inpatient with expected length of stay greater than 2 midnights.  Signed: Velna Ochs, MD 11/29/2016, 2:17 PM  Pager: [phone]

## 2016-11-29 NOTE — Progress Notes (Signed)
New Admission Note:   Arrival Method: stretcher from ED  Mental Orientation: alert and orientated x4 Telemetry: n/a Assessment: Completed Skin: WDL intact HW:TUUE AC  Pain: denies  Tubes:n/a Safety Measures: Safety Fall Prevention Plan discussed  Admission: pending  [facility] Orientation: Patient has been orientated to the room, unit and staff.  Family: husband at bedside   Orders have been reviewed and implemented. Will continue to monitor the patient. Call light has been placed within reach and bed alarm has been activated.   Emilio Math, RN [facility]  Phone number: [phone]

## 2016-11-29 NOTE — ED Notes (Signed)
Pt ambulated to bathroom 

## 2016-11-29 NOTE — ED Triage Notes (Signed)
Pt reports hx of crohns, states she started having abdominal pain and nausea on Wednesday after coming off of prednisone, pt states dr Hilarie Fredrickson sent her here for ct scan, possible admission and surgery. Pt a/ox4, resp e/u.

## 2016-11-29 NOTE — ED Provider Notes (Signed)
[facility] DEPT Provider Note   CSN: [id number] Arrival date & time: 11/29/16  1016     History   Chief Complaint Chief Complaint  Patient presents with  . Abdominal Pain  . Nausea    HPI Donna Richmond is a 61 y.o. female.  HPI   Donna Richmond is a 61 y.o. female, with a history of Crohn's colitis, iron deficiency anemia, and HTN, presenting to the ED with abdominal pain. Patient is accompanied by her husband at the bedside. Current pain is periumbilical, sharp, rated 7/54. She reports she has hematochezia at baseline.   Endorses chills, wretching, and severe abdominal pain beginning two weeks before Christmas 2017. Had a "Crohn's collapse" at that time. Husband describes this as a sudden, severe onset of abdominal pain accompanied by pallor and nausea that makes it so the patient can not function. Has been on prednisone and Flagyl since January 1. Also started on humira in January. Husband reports the patient has current bowel abscesses that have to be periodically monitored.   Saw Dr. Hilarie Fredrickson, [facility], Friday Feb 23. When Pyrtle checked on the patient this morning, pt was still having pain and wretching. Was told to come to the ED for IV fluids, CT scan, and admission. Last PO yesterday.   Denies fever/chills, vomiting, LOC, or any other complaints.  Past Medical History:  Diagnosis Date  . B12 deficiency   . Crohn's colitis ([place])   . Hypertension   . IDA (iron deficiency anemia)   . Proctitis   . Ulcerative ileocolitis ([place])     There are no active problems to display for this patient.   Past Surgical History:  Procedure Laterality Date  . DILATION AND CURETTAGE OF UTERUS    . TUBAL LIGATION      OB History    No data available       Home Medications    Prior to Admission medications   Medication Sig Start Date End Date Taking? Authorizing Provider  Fe Fum-FePoly-Vit C-Vit B3 (INTEGRA) 62.5-62.5-40-3 MG CAPS Take 1 capsule by mouth daily.   Yes  Historical Provider, MD  metroNIDAZOLE (FLAGYL) 500 MG tablet Take 500 mg by mouth 3 (three) times daily.   Yes Historical Provider, MD  Adalimumab (HUMIRA PEN) 40 MG/0.8ML PNKT Inject 40 mg into the skin every 14 (fourteen) days. 09/23/16   Jerene Bears, MD  Olmesartan-Amlodipine-HCTZ 40-5-12.5 MG TABS Take 1 capsule by mouth daily.    Historical Provider, MD  vitamin B-12 (CYANOCOBALAMIN) 1000 MCG tablet Take 1,000 mcg by mouth daily.    Historical Provider, MD    Family History Family History  Problem Relation Age of Onset  . Colon cancer Neg Hx     Social History Social History  Substance Use Topics  . Smoking status: Never Smoker  . Smokeless tobacco: Never Used  . Alcohol use No     Allergies   Biaxin [clarithromycin]   Review of Systems Review of Systems  Constitutional: Negative for chills and fever.  Respiratory: Negative for cough and shortness of breath.   Gastrointestinal: Positive for abdominal pain and nausea. Negative for vomiting.  Genitourinary: Negative for dysuria and frequency.  Musculoskeletal: Negative for back pain.  Skin: Negative for pallor.  Neurological: Negative for dizziness and light-headedness.  All other systems reviewed and are negative.    Physical Exam Updated Vital Signs BP 116/70   Pulse 94   Temp 97.8 F (36.6 C) (Oral)   Resp 16  SpO2 100%   Physical Exam  Constitutional: She appears well-developed and well-nourished. No distress.  HENT:  Head: Normocephalic and atraumatic.  Eyes: Conjunctivae are normal.  Neck: Neck supple.  Cardiovascular: Normal rate, regular rhythm, normal heart sounds and intact distal pulses.   Pulmonary/Chest: Effort normal and breath sounds normal. No respiratory distress.  Abdominal: Soft. There is tenderness in the periumbilical area. There is no guarding.  Musculoskeletal: She exhibits no edema.  Lymphadenopathy:    She has no cervical adenopathy.  Neurological: She is alert.  Skin: Skin  is warm and dry. She is not diaphoretic.  Psychiatric: She has a normal mood and affect. Her behavior is normal.  Nursing note and vitals reviewed.    ED Treatments / Results  Labs (all labs ordered are listed, but only abnormal results are displayed) Labs Reviewed  COMPREHENSIVE METABOLIC PANEL - Abnormal; Notable for the following:       Result Value   Potassium 3.0 (*)    Chloride 99 (*)    Glucose, Bld 103 (*)    Creatinine, Ser 1.17 (*)    Albumin 3.4 (*)    ALT 13 (*)    GFR calc non Af Amer 50 (*)    GFR calc Af Amer 57 (*)    All other components within normal limits  CBC - Abnormal; Notable for the following:    RDW 15.6 (*)    All other components within normal limits  URINALYSIS, ROUTINE W REFLEX MICROSCOPIC - Abnormal; Notable for the following:    Hgb urine dipstick SMALL (*)    Leukocytes, UA SMALL (*)    Squamous Epithelial / LPF 0-5 (*)    All other components within normal limits  LIPASE, BLOOD    EKG  EKG Interpretation None       Radiology Ct Abdomen Pelvis W Contrast  Result Date: 11/29/2016 CLINICAL DATA:  Abdominal pain with nausea and vomiting. History of Crohn's disease. EXAM: CT ABDOMEN AND PELVIS WITH CONTRAST TECHNIQUE: Multidetector CT imaging of the abdomen and pelvis was performed using the standard protocol following bolus administration of intravenous contrast. CONTRAST:  170m ISOVUE-300 IOPAMIDOL (ISOVUE-300) INJECTION 61% COMPARISON:  CT 11/02/2016 and 10/12/2016. FINDINGS: Lower chest: Clear lung bases. Small pericardial effusion appears unchanged. There is no pleural effusion. Hepatobiliary: The liver appears normal. No evidence of gallstones, gallbladder wall thickening or biliary dilatation. Pancreas: Unremarkable. No pancreatic ductal dilatation or surrounding inflammatory changes. Spleen: Normal in size without focal abnormality. Adrenals/Urinary Tract: Both adrenal glands appear normal. There are stable tiny low-density renal lesions  bilaterally, likely cysts. No evidence of enhancing renal mass, urinary tract calculus or hydronephrosis. The bladder appears normal. Stomach/Bowel: There are persistent extensive inflammatory changes involving the terminal ileum. There is associated wall thickening and surrounding inflammation. A small tubular fluid and air collection extends medially, potentially a small entero entero fistula. The cecum appears normal. No colonic inflammatory changes are identified. There is no evidence of acute appendiceal inflammation. There is a small appendicolith which appears unchanged. Vascular/Lymphatic: Stable small reactive lymph nodes in the ileocolonic mesentery. There is aortic and branch vessel atherosclerosis. No acute vascular findings are seen. Reproductive: The uterus and ovaries appear normal. Other: Stable small umbilical hernia containing only fat. No ascites or free air. Musculoskeletal: No acute or significant osseous findings. No evidence of sacroiliitis. IMPRESSION: 1. Persistent extensive inflammatory changes involving the terminal ileum consistent with active Crohn's disease. Small extraluminal tubular fluid collection may reflect a developing entero entero fistula. 2.  No evidence of obstruction perforation. 3. Appendicolith without evidence of acute appendiceal inflammation. Electronically Signed   By: Richardean Sale M.D.   On: 11/29/2016 13:10    Procedures Procedures (including critical care time)  Medications Ordered in ED Medications  potassium chloride 30 mEq in sodium chloride 0.9 % 265 mL (KCL MULTIRUN) IVPB (30 mEq Intravenous Given 11/29/16 1344)  sodium chloride 0.9 % bolus 1,000 mL (0 mLs Intravenous Stopped 11/29/16 1340)    Followed by  sodium chloride 0.9 % bolus 1,000 mL (0 mLs Intravenous Stopped 11/29/16 1340)  ondansetron (ZOFRAN) injection 4 mg (4 mg Intravenous Given 11/29/16 1206)  iopamidol (ISOVUE-300) 61 % injection (100 mLs  Contrast Given 11/29/16 1239)    methylPREDNISolone sodium succinate (SOLU-MEDROL) 40 mg/mL injection 20 mg (20 mg Intravenous Given 11/29/16 1344)     Initial Impression / Assessment and Plan / ED Course  I have reviewed the triage vital signs and the nursing notes.  Pertinent labs & imaging results that were available during my care of the patient were reviewed by me and considered in my medical decision making (see chart for details).     Patient presents with continued abdominal pain and nausea secondary to Crohn's. Patient was offered pain management, but declined. CT shows possible small fistula, but no noted abscess. Hypokalemia noted and addressed. 1:36 PM Spoke with Dr. Hilarie Fredrickson, GI specialist. Requests two doses of 49m solumedrol IV 12 hours apart, for a total of 40 mg. Admission via hospitalist. 2:19 PM Spoke with Dr. BCharlynn Grimes IM resident, who agreed to admit the patient. Dr. PVena Ruarequested solumedrol regimen was communicated with Dr. BCharlynn Grimes   Findings and plan of care discussed with DVeryl Speak MD. Dr. DStark Jockpersonally evaluated and examined this patient.   Vitals:   11/29/16 1145 11/29/16 1200 11/29/16 1215 11/29/16 1230  BP: 117/62 111/61 (!) 109/50 112/57  Pulse: 86 89 69 71  Resp:    16  Temp:      TempSrc:      SpO2: 100% 97% 98% 100%     Final Clinical Impressions(s) / ED Diagnoses   Final diagnoses:  Periumbilical abdominal pain  AKI ([place] kidney injury) [place]  Exacerbation of Crohn's disease with fistula ([facility]    New Prescriptions New Prescriptions   No medications on file     SLorayne Bender PA-C 11/29/16 [place] MD 11/30/16 [phone]

## 2016-11-30 DIAGNOSIS — E440 Moderate protein-calorie malnutrition: Secondary | ICD-10-CM | POA: Diagnosis present

## 2016-11-30 LAB — BASIC METABOLIC PANEL
ANION GAP: 9 (ref 5–15)
BUN: 11 mg/dL (ref 6–20)
CALCIUM: 9.3 mg/dL (ref 8.9–10.3)
CO2: 22 mmol/L (ref 22–32)
CREATININE: 0.86 mg/dL (ref 0.44–1.00)
Chloride: 111 mmol/L (ref 101–111)
GLUCOSE: 123 mg/dL — AB (ref 65–99)
Potassium: 4.6 mmol/L (ref 3.5–5.1)
Sodium: 142 mmol/L (ref 135–145)

## 2016-11-30 LAB — HIV ANTIBODY (ROUTINE TESTING W REFLEX): HIV Screen 4th Generation wRfx: NONREACTIVE

## 2016-11-30 LAB — CBC
HCT: 38.2 % (ref 36.0–46.0)
Hemoglobin: 12.1 g/dL (ref 12.0–15.0)
MCH: 27.6 pg (ref 26.0–34.0)
MCHC: 31.7 g/dL (ref 30.0–36.0)
MCV: 87 fL (ref 78.0–100.0)
PLATELETS: 255 10*3/uL (ref 150–400)
RBC: 4.39 MIL/uL (ref 3.87–5.11)
RDW: 15.1 % (ref 11.5–15.5)
WBC: 3.1 10*3/uL — ABNORMAL LOW (ref 4.0–10.5)

## 2016-11-30 MED ORDER — ENSURE ENLIVE PO LIQD
237.0000 mL | Freq: Two times a day (BID) | ORAL | Status: DC
  Administered 2016-12-01: 237 mL via ORAL

## 2016-11-30 MED ORDER — SODIUM CHLORIDE 0.9 % IV SOLN
1.5000 g | Freq: Four times a day (QID) | INTRAVENOUS | Status: DC
  Administered 2016-11-30 – 2016-12-02 (×6): 1.5 g via INTRAVENOUS
  Filled 2016-11-30 (×8): qty 1.5

## 2016-11-30 MED ORDER — METHYLPREDNISOLONE SODIUM SUCC 40 MG IJ SOLR
20.0000 mg | Freq: Three times a day (TID) | INTRAMUSCULAR | Status: DC
  Administered 2016-11-30 – 2016-12-01 (×2): 20 mg via INTRAVENOUS
  Filled 2016-11-30 (×2): qty 1

## 2016-11-30 MED ORDER — SODIUM CHLORIDE 0.9 % IV SOLN
INTRAVENOUS | Status: DC
  Administered 2016-11-30 – 2016-12-01 (×2): via INTRAVENOUS
  Administered 2016-12-01: 1000 mL via INTRAVENOUS

## 2016-11-30 NOTE — Progress Notes (Signed)
   Subjective: Patient is doing well today, no events overnight. Reports significant improvement in her nausea and abdominal pain this morning after solumedrol yesterday. Requesting to eat.   Objective:  Vital signs in last 24 hours: Vitals:   11/29/16 1705 11/29/16 2151 11/30/16 0426 11/30/16 1000  BP: (!) 115/55 (!) 103/46 (!) 121/50 (!) 118/57  Pulse: 70 67 61 62  Resp:  17 20 20   Temp: 99.8 F (37.7 C) 98.2 F (36.8 C) 98 F (36.7 C) 98.6 F (37 C)  TempSrc: Oral Oral Oral Oral  SpO2: 98% 98% 100% 100%  Weight:  155 lb (70.3 kg)     Physical Exam Constitutional: NAD, appears comfortable Cardiovascular: RRR, no murmurs, rubs, or gallops.  Pulmonary/Chest: CTAB, no wheezes, rales, or rhonchi.  Abdominal: Soft, non tender, non distended. +BS. Mildly tender to palpation over suprapubic region. No rebound or guarding.  Extremities: Warm and well perfused. Distal pulses intact. No edema.  Neurological: A&Ox3, CN II - XII grossly intact.    Assessment/Plan:  Patient is a 61 yo F with a pmhx of ileocolonic and perianal Crohn's disease complicated by intraabdominal abscesses who presented with worsening nausea and abdominal pain, now with imaging concerning for a developing entero-enteric fistula.   Crohn's Ileocolitis: Complicated by intraabdominal abscesses and CT concerning for entero-enteric fistula. Patient recently completed a long course of antibiotics that were started in January of 2018. She reports significant improvement in her symptoms today s/p 2 doses of solumedrol. She has remained afebrile. Patient and husband report a long conversation yesterday with Dr. Hilarie Fredrickson regarding surgical options. Patient would now like to postpone surgery as long as possible given her improvement in symptoms.  -- GI following; appreciate recommendations.  -- Hold off on further antibiotics for now  -- S/p  solumedrol 20 mg x 2 (12 hours apart)   -- Zofran prn for nausea  -- Humira q weekly  per GI  -- Patient declined pain medication   Hyokalemia: Resolved   AKI: Resolved s/p 2L bolus in ED and NS @ 100 overnight. Likely pre-renal from poor po intake.  -- Stop fluids -- Hold home antihypertensives   HTN: Currently normotensive. Patient takes olmesartan-amlodipine-hctz 40-5-12.5 mg at home. Held on admission due to AKI. -- Monitor vitals  -- Hold home antihypertensives for now   FEN: No fluids, replete lytes prn, advance diet as tolerated  VTE ppx: Lovenox  Code Status: FULL   Dispo: Anticipated discharge pending GI recommendations.   Velna Ochs, MD 11/30/2016, 11:02 AM Pager: [phone]

## 2016-11-30 NOTE — Consult Note (Signed)
Reason for Consult: Crohn's disease, abdominal pain  Referring Physician: Idy Rawling is an 61 y.o. female.  HPI: I saw Ms. Donna Richmond today in conjunction with Dr. Elmo Putt. As noted she is otherwise fairly healthy 61 year old female diagnosed with ileocolonic Crohn's disease approximately 5 years ago. This was diagnosed in[place] by Dr. Lyndel Safe on colonoscopy. She has also had some anorectal disease which is not currently active.  She was doing reasonably well until late last year when she developed recurrent abdominal pain. She was reevaluated with CT scan 10/12/2016 showing active Crohn's disease with severe inflammation of the terminal ileum and several small abscesses consistent with walled off perforations. She was treated with oral antibiotics and a steroid taper until February 22. Soon thereafter she developed nausea and chills and midabdominal pain. Having some frequent loose stools. She was seen in the office at that time and differential was felt to be possible gastroenteritis versus worsening Crohn's.  He had been on weekly Humira started 10/01/2016 as well. Plan was to complete her antibiotics and finished steroid wean and repeat her CT scan. However 3 days ago she called with worsening abdominal pain and chills. She had taken 40 mg of prednisone with some improvement. She was advised to restart oral antibiotics and continue prednisone. However she had persistent mid abdominal pain and nausea and weakness. He was advised to go to the emergency department and was admitted on February 26. Since admission she has been on IV fluids, and IV  steroids. She currently is feeling significantly better with essentially resolution of her abdominal pain.  Past Medical History:  Diagnosis Date  . B12 deficiency   . Crohn's colitis ([place])   . Hypertension   . IDA (iron deficiency anemia)   . Proctitis   . Ulcerative ileocolitis ([place])     Past Surgical History:  Procedure Laterality Date  .  DILATION AND CURETTAGE OF UTERUS    . TUBAL LIGATION      Family History  Problem Relation Age of Onset  . Colon cancer Neg Hx     Social History:  reports that she has never smoked. She has never used smokeless tobacco. She reports that she does not drink alcohol or use drugs.  Allergies:  Allergies  Allergen Reactions  . Biaxin [Clarithromycin] Hives    Current Facility-Administered Medications  Medication Dose Route Frequency Provider Last Rate Last Dose  . 0.9 %  sodium chloride infusion  250 mL Intravenous PRN Maryellen Pile, MD      . enoxaparin (LOVENOX) injection 40 mg  40 mg Subcutaneous Q24H Maryellen Pile, MD   40 mg at 11/29/16 1705  . feeding supplement (ENSURE ENLIVE) (ENSURE ENLIVE) liquid 237 mL  237 mL Oral BID BM Annia Belt, MD      . ondansetron [facility]) tablet 4 mg  4 mg Oral Q6H PRN Maryellen Pile, MD       Or  . ondansetron [facility]) injection 4 mg  4 mg Intravenous Q6H PRN Maryellen Pile, MD      . sodium chloride flush (NS) 0.9 % injection 3 mL  3 mL Intravenous Q12H Maryellen Pile, MD      . sodium chloride flush (NS) 0.9 % injection 3 mL  3 mL Intravenous PRN Maryellen Pile, MD         Results for orders placed or performed during the hospital encounter of 11/29/16 (from the past 48 hour(s))  Lipase, blood     Status: None   Collection  Time: 11/29/16 11:09 AM  Result Value Ref Range   Lipase 14 11 - 51 U/L  Comprehensive metabolic panel     Status: Abnormal   Collection Time: 11/29/16 11:09 AM  Result Value Ref Range   Sodium 137 135 - 145 mmol/L   Potassium 3.0 (L) 3.5 - 5.1 mmol/L   Chloride 99 (L) 101 - 111 mmol/L   CO2 26 22 - 32 mmol/L   Glucose, Bld 103 (H) 65 - 99 mg/dL   BUN 14 6 - 20 mg/dL   Creatinine, Ser 1.17 (H) 0.44 - 1.00 mg/dL   Calcium 9.3 8.9 - 10.3 mg/dL   Total Protein 6.6 6.5 - 8.1 g/dL   Albumin 3.4 (L) 3.5 - 5.0 g/dL   AST 17 15 - 41 U/L   ALT 13 (L) 14 - 54 U/L   Alkaline Phosphatase 49 38 - 126 U/L   Total  Bilirubin 0.9 0.3 - 1.2 mg/dL   GFR calc non Af Amer 50 (L) >60 mL/min   GFR calc Af Amer 57 (L) >60 mL/min    Comment: (NOTE) The eGFR has been calculated using the CKD EPI equation. This calculation has not been validated in all clinical situations. eGFR's persistently <60 mL/min signify possible Chronic Kidney Disease.    Anion gap 12 5 - 15  CBC     Status: Abnormal   Collection Time: 11/29/16 11:09 AM  Result Value Ref Range   WBC 6.8 4.0 - 10.5 K/uL   RBC 4.53 3.87 - 5.11 MIL/uL   Hemoglobin 12.7 12.0 - 15.0 g/dL   HCT 39.1 36.0 - 46.0 %   MCV 86.3 78.0 - 100.0 fL   MCH 28.0 26.0 - 34.0 pg   MCHC 32.5 30.0 - 36.0 g/dL   RDW 15.6 (H) 11.5 - 15.5 %   Platelets 254 150 - 400 K/uL  Urinalysis, Routine w reflex microscopic     Status: Abnormal   Collection Time: 11/29/16 11:47 AM  Result Value Ref Range   Color, Urine YELLOW YELLOW   APPearance CLEAR CLEAR   Specific Gravity, Urine 1.012 1.005 - 1.030   pH 5.0 5.0 - 8.0   Glucose, UA NEGATIVE NEGATIVE mg/dL   Hgb urine dipstick SMALL (A) NEGATIVE   Bilirubin Urine NEGATIVE NEGATIVE   Ketones, ur NEGATIVE NEGATIVE mg/dL   Protein, ur NEGATIVE NEGATIVE mg/dL   Nitrite NEGATIVE NEGATIVE   Leukocytes, UA SMALL (A) NEGATIVE   RBC / HPF 0-5 0 - 5 RBC/hpf   WBC, UA 0-5 0 - 5 WBC/hpf   Bacteria, UA NONE SEEN NONE SEEN   Squamous Epithelial / LPF 0-5 (A) NONE SEEN   Mucous PRESENT   HIV antibody (Routine Testing)     Status: None   Collection Time: 11/29/16  4:17 PM  Result Value Ref Range   HIV Screen 4th Generation wRfx Non Reactive Non Reactive    Comment: (NOTE) Performed At: [facility] [place], [place] [id number] Lindon Romp MD [id number]   Basic metabolic panel     Status: Abnormal   Collection Time: 11/30/16  7:42 AM  Result Value Ref Range   Sodium 142 135 - 145 mmol/L   Potassium 4.6 3.5 - 5.1 mmol/L   Chloride 111 101 - 111 mmol/L   CO2 22 22 - 32 mmol/L   Glucose, Bld 123  (H) 65 - 99 mg/dL   BUN 11 6 - 20 mg/dL   Creatinine, Ser 0.86 0.44 - 1.00 mg/dL  Calcium 9.3 8.9 - 10.3 mg/dL   GFR calc non Af Amer >60 >60 mL/min   GFR calc Af Amer >60 >60 mL/min    Comment: (NOTE) The eGFR has been calculated using the CKD EPI equation. This calculation has not been validated in all clinical situations. eGFR's persistently <60 mL/min signify possible Chronic Kidney Disease.    Anion gap 9 5 - 15  CBC     Status: Abnormal   Collection Time: 11/30/16  7:42 AM  Result Value Ref Range   WBC 3.1 (L) 4.0 - 10.5 K/uL   RBC 4.39 3.87 - 5.11 MIL/uL   Hemoglobin 12.1 12.0 - 15.0 g/dL   HCT 38.2 36.0 - 46.0 %   MCV 87.0 78.0 - 100.0 fL   MCH 27.6 26.0 - 34.0 pg   MCHC 31.7 30.0 - 36.0 g/dL   RDW 15.1 11.5 - 15.5 %   Platelets 255 150 - 400 K/uL    Ct Abdomen Pelvis W Contrast  Result Date: 11/29/2016 CLINICAL DATA:  Abdominal pain with nausea and vomiting. History of Crohn's disease. EXAM: CT ABDOMEN AND PELVIS WITH CONTRAST TECHNIQUE: Multidetector CT imaging of the abdomen and pelvis was performed using the standard protocol following bolus administration of intravenous contrast. CONTRAST:  168m ISOVUE-300 IOPAMIDOL (ISOVUE-300) INJECTION 61% COMPARISON:  CT 11/02/2016 and 10/12/2016. FINDINGS: Lower chest: Clear lung bases. Small pericardial effusion appears unchanged. There is no pleural effusion. Hepatobiliary: The liver appears normal. No evidence of gallstones, gallbladder wall thickening or biliary dilatation. Pancreas: Unremarkable. No pancreatic ductal dilatation or surrounding inflammatory changes. Spleen: Normal in size without focal abnormality. Adrenals/Urinary Tract: Both adrenal glands appear normal. There are stable tiny low-density renal lesions bilaterally, likely cysts. No evidence of enhancing renal mass, urinary tract calculus or hydronephrosis. The bladder appears normal. Stomach/Bowel: There are persistent extensive inflammatory changes involving the  terminal ileum. There is associated wall thickening and surrounding inflammation. A small tubular fluid and air collection extends medially, potentially a small entero entero fistula. The cecum appears normal. No colonic inflammatory changes are identified. There is no evidence of acute appendiceal inflammation. There is a small appendicolith which appears unchanged. Vascular/Lymphatic: Stable small reactive lymph nodes in the ileocolonic mesentery. There is aortic and branch vessel atherosclerosis. No acute vascular findings are seen. Reproductive: The uterus and ovaries appear normal. Other: Stable small umbilical hernia containing only fat. No ascites or free air. Musculoskeletal: No acute or significant osseous findings. No evidence of sacroiliitis. IMPRESSION: 1. Persistent extensive inflammatory changes involving the terminal ileum consistent with active Crohn's disease. Small extraluminal tubular fluid collection may reflect a developing entero entero fistula. 2. No evidence of obstruction perforation. 3. Appendicolith without evidence of acute appendiceal inflammation. Electronically Signed   By: WRichardean SaleM.D.   On: 11/29/2016 13:10    Review of Systems  Constitutional: Negative for chills and fever.  Respiratory: Negative.   Cardiovascular: Negative.   Gastrointestinal: Positive for abdominal pain, diarrhea and nausea. Negative for vomiting.  Genitourinary: Negative.    Blood pressure (!) 118/57, pulse 62, temperature 98.6 F (37 C), temperature source Oral, resp. rate 20, weight 70.3 kg (155 lb), SpO2 100 %. Physical Exam General: Alert, well-developed occasion female, in no distress Skin: Warm and dry without rash or infection. HEENT: Mild moon facies. No palpable masses or thyromegaly. Sclera nonicteric. Pupils equal round and reactive.  Lymph nodes: No cervical, supraclavicular, or inguinal nodes palpable. Lungs: Breath sounds clear and equal without increased work of  breathing Cardiovascular:  Regular rate and rhythm without murmur. No JVD or edema. Peripheral pulses intact. Abdomen: Nondistended. Soft and nontender. No masses palpable. No organomegaly. No palpable hernias. Extremities: No edema or joint swelling or deformity. No chronic venous stasis changes. Neurologic: Alert and fully oriented. Neck normal  Assessment/Plan: 62 year old female with possibly five-year history of ileocolonic Crohn's disease. No previous surgical resection. She has had gradually worsening symptoms over the last 2 months with recurrent abdominal pain and fever. CT scan has shown significant active Crohn's ileitis with small abscesses/walled off perforations. I have personally reviewed her current CT scan. She has a fairly short segment of terminal ileum with marked inflammatory changes but no obstruction. There may be evidence of an enteric enteric fistula. She has been on essentially maximum medical management and I believe is heading toward requiring surgery. There is currently no emergency need for surgery. I think she would be best served with minimizing her immunosuppressive medications and planned elective surgery with a bowel prep. This was discussed with the patient and Dr. Hilarie Fredrickson and all of her and her husband's questions answered. Plan is to treat this flare medically which seems to be responding I will follow-up as an outpatient to discuss more elective surgical resection.  Rhen Kawecki T 11/30/2016, 5:01 PM

## 2016-11-30 NOTE — Consult Note (Signed)
[facility] Surgery Consult/Admission Note  Donna Richmond November 25, 1955  [id number].    Requesting MD: Dr. Hilarie Fredrickson Chief Complaint/Reason for Consult: Chron's  HPI:   Patient is a 61 year old female with a history of ileocolonic and perianal Crohn's disease complicated by intra-abdominal abscesses in January 2018 who presented to the ED with complaints of worsening abdominal pain and nausea. Patient was started on Cipro and Flagyl in January for the intra-abdominal abscesses and completed a course of prednisone last week. She states 3-4 days after her last dose of prednisone she began experiencing worsening abdominal pain in the suprapubic region. Pain was intermittent, nonradiating. On 2/24 pt took 40 mg Prednisone with some relief.  Dr Loletha Carrow spoke with pt on 2/24 and advised no more prednisone and to start oral Cipro 500 mg BID and Metronidazole 500 mg TID. This did not improve her symptoms. Patient had associated nausea, chills, dry heaving. Patient denies recent illness, vomiting, diarrhea, blood in her stool, chest pain, shortness of breath. Husband was at bedside.  Currently patient is not having any abdominal pain. She is tolerating her diet. No nausea vomiting.  ED course: Labs: Potassium 3, creatinine 1.17, GFR 50 CT scan 2/26:   1. Persistent extensive inflammatory changes involving the terminal ileum consistent with active Crohn's disease. Small extraluminal tubular fluid collection may reflect a developing entero-entero fistula. 2. No evidence of obstruction perforation.  3. Appendicolith without evidence of acute appendiceal inflammation.  ROS:  Review of Systems  Constitutional: Positive for chills. Negative for diaphoresis and fever.  HENT: Negative for congestion and sore throat.   Eyes: Negative for pain and discharge.  Respiratory: Negative for cough and shortness of breath.   Cardiovascular: Negative for chest pain and palpitations.  Gastrointestinal: Positive for  abdominal pain and nausea. Negative for blood in stool, constipation, diarrhea, melena and vomiting.  Genitourinary: Negative for dysuria and hematuria.  Skin: Negative for rash.  Neurological: Negative for dizziness, loss of consciousness and headaches.  All other systems reviewed and are negative.    Family History  Problem Relation Age of Onset  . Colon cancer Neg Hx     Past Medical History:  Diagnosis Date  . B12 deficiency   . Crohn's colitis ([place])   . Hypertension   . IDA (iron deficiency anemia)   . Proctitis   . Ulcerative ileocolitis ([place])     Past Surgical History:  Procedure Laterality Date  . DILATION AND CURETTAGE OF UTERUS    . TUBAL LIGATION      Social History:  reports that she has never smoked. She has never used smokeless tobacco. She reports that she does not drink alcohol or use drugs.  Allergies:  Allergies  Allergen Reactions  . Biaxin [Clarithromycin] Hives    Medications Prior to Admission  Medication Sig Dispense Refill  . Adalimumab (HUMIRA PEN) 40 MG/0.8ML PNKT Inject 40 mg into the skin every 14 (fourteen) days. 2 each 11  . Olmesartan-Amlodipine-HCTZ 40-5-12.5 MG TABS Take 1 capsule by mouth daily.    . vitamin B-12 (CYANOCOBALAMIN) 1000 MCG tablet Take 1,000 mcg by mouth daily.      Blood pressure (!) 118/57, pulse 62, temperature 98.6 F (37 C), temperature source Oral, resp. rate 20, weight 155 lb (70.3 kg), SpO2 100 %.  Physical Exam  Constitutional: She is oriented to person, place, and time and well-developed, well-nourished, and in no distress. Vital signs are normal. No distress.  Well-appearing white female  HENT:  Head: Normocephalic and atraumatic.  Nose: Nose normal.  Eyes: Conjunctivae are normal. Right eye exhibits no discharge. Left eye exhibits no discharge. No scleral icterus.  Neck: Normal range of motion. Neck supple.  Cardiovascular: Normal rate, regular rhythm, normal heart sounds and intact distal pulses.   Exam reveals no gallop and no friction rub.   No murmur heard. Pulses:      Radial pulses are 2+ on the right side, and 2+ on the left side.  Pulmonary/Chest: Effort normal and breath sounds normal. No respiratory distress. She has no decreased breath sounds. She has no wheezes. She has no rhonchi. She has no rales.  Abdominal: Soft. Normal appearance and bowel sounds are normal. She exhibits no distension and no mass. There is no tenderness. There is no rigidity, no rebound and no guarding.  Musculoskeletal: Normal range of motion. She exhibits no edema or deformity.  Neurological: She is alert and oriented to person, place, and time.  Skin: Skin is warm and dry. No rash noted. She is not diaphoretic.  Psychiatric: Mood and affect normal.  Nursing note and vitals reviewed.   Results for orders placed or performed during the hospital encounter of 11/29/16 (from the past 48 hour(s))  Lipase, blood     Status: None   Collection Time: 11/29/16 11:09 AM  Result Value Ref Range   Lipase 14 11 - 51 U/L  Comprehensive metabolic panel     Status: Abnormal   Collection Time: 11/29/16 11:09 AM  Result Value Ref Range   Sodium 137 135 - 145 mmol/L   Potassium 3.0 (L) 3.5 - 5.1 mmol/L   Chloride 99 (L) 101 - 111 mmol/L   CO2 26 22 - 32 mmol/L   Glucose, Bld 103 (H) 65 - 99 mg/dL   BUN 14 6 - 20 mg/dL   Creatinine, Ser 1.17 (H) 0.44 - 1.00 mg/dL   Calcium 9.3 8.9 - 10.3 mg/dL   Total Protein 6.6 6.5 - 8.1 g/dL   Albumin 3.4 (L) 3.5 - 5.0 g/dL   AST 17 15 - 41 U/L   ALT 13 (L) 14 - 54 U/L   Alkaline Phosphatase 49 38 - 126 U/L   Total Bilirubin 0.9 0.3 - 1.2 mg/dL   GFR calc non Af Amer 50 (L) >60 mL/min   GFR calc Af Amer 57 (L) >60 mL/min    Comment: (NOTE) The eGFR has been calculated using the CKD EPI equation. This calculation has not been validated in all clinical situations. eGFR's persistently <60 mL/min signify possible Chronic Kidney Disease.    Anion gap 12 5 - 15  CBC      Status: Abnormal   Collection Time: 11/29/16 11:09 AM  Result Value Ref Range   WBC 6.8 4.0 - 10.5 K/uL   RBC 4.53 3.87 - 5.11 MIL/uL   Hemoglobin 12.7 12.0 - 15.0 g/dL   HCT 39.1 36.0 - 46.0 %   MCV 86.3 78.0 - 100.0 fL   MCH 28.0 26.0 - 34.0 pg   MCHC 32.5 30.0 - 36.0 g/dL   RDW 15.6 (H) 11.5 - 15.5 %   Platelets 254 150 - 400 K/uL  Urinalysis, Routine w reflex microscopic     Status: Abnormal   Collection Time: 11/29/16 11:47 AM  Result Value Ref Range   Color, Urine YELLOW YELLOW   APPearance CLEAR CLEAR   Specific Gravity, Urine 1.012 1.005 - 1.030   pH 5.0 5.0 - 8.0   Glucose, UA NEGATIVE NEGATIVE mg/dL   Hgb urine dipstick  SMALL (A) NEGATIVE   Bilirubin Urine NEGATIVE NEGATIVE   Ketones, ur NEGATIVE NEGATIVE mg/dL   Protein, ur NEGATIVE NEGATIVE mg/dL   Nitrite NEGATIVE NEGATIVE   Leukocytes, UA SMALL (A) NEGATIVE   RBC / HPF 0-5 0 - 5 RBC/hpf   WBC, UA 0-5 0 - 5 WBC/hpf   Bacteria, UA NONE SEEN NONE SEEN   Squamous Epithelial / LPF 0-5 (A) NONE SEEN   Mucous PRESENT   HIV antibody (Routine Testing)     Status: None   Collection Time: 11/29/16  4:17 PM  Result Value Ref Range   HIV Screen 4th Generation wRfx Non Reactive Non Reactive    Comment: (NOTE) Performed At: [facility] [place], [place] [id number] Lindon Romp MD BJ:[id number]   Basic metabolic panel     Status: Abnormal   Collection Time: 11/30/16  7:42 AM  Result Value Ref Range   Sodium 142 135 - 145 mmol/L   Potassium 4.6 3.5 - 5.1 mmol/L   Chloride 111 101 - 111 mmol/L   CO2 22 22 - 32 mmol/L   Glucose, Bld 123 (H) 65 - 99 mg/dL   BUN 11 6 - 20 mg/dL   Creatinine, Ser 0.86 0.44 - 1.00 mg/dL   Calcium 9.3 8.9 - 10.3 mg/dL   GFR calc non Af Amer >60 >60 mL/min   GFR calc Af Amer >60 >60 mL/min    Comment: (NOTE) The eGFR has been calculated using the CKD EPI equation. This calculation has not been validated in all clinical situations. eGFR's persistently <60 mL/min  signify possible Chronic Kidney Disease.    Anion gap 9 5 - 15  CBC     Status: Abnormal   Collection Time: 11/30/16  7:42 AM  Result Value Ref Range   WBC 3.1 (L) 4.0 - 10.5 K/uL   RBC 4.39 3.87 - 5.11 MIL/uL   Hemoglobin 12.1 12.0 - 15.0 g/dL   HCT 38.2 36.0 - 46.0 %   MCV 87.0 78.0 - 100.0 fL   MCH 27.6 26.0 - 34.0 pg   MCHC 31.7 30.0 - 36.0 g/dL   RDW 15.1 11.5 - 15.5 %   Platelets 255 150 - 400 K/uL   Ct Abdomen Pelvis W Contrast  Result Date: 11/29/2016 CLINICAL DATA:  Abdominal pain with nausea and vomiting. History of Crohn's disease. EXAM: CT ABDOMEN AND PELVIS WITH CONTRAST TECHNIQUE: Multidetector CT imaging of the abdomen and pelvis was performed using the standard protocol following bolus administration of intravenous contrast. CONTRAST:  126m ISOVUE-300 IOPAMIDOL (ISOVUE-300) INJECTION 61% COMPARISON:  CT 11/02/2016 and 10/12/2016. FINDINGS: Lower chest: Clear lung bases. Small pericardial effusion appears unchanged. There is no pleural effusion. Hepatobiliary: The liver appears normal. No evidence of gallstones, gallbladder wall thickening or biliary dilatation. Pancreas: Unremarkable. No pancreatic ductal dilatation or surrounding inflammatory changes. Spleen: Normal in size without focal abnormality. Adrenals/Urinary Tract: Both adrenal glands appear normal. There are stable tiny low-density renal lesions bilaterally, likely cysts. No evidence of enhancing renal mass, urinary tract calculus or hydronephrosis. The bladder appears normal. Stomach/Bowel: There are persistent extensive inflammatory changes involving the terminal ileum. There is associated wall thickening and surrounding inflammation. A small tubular fluid and air collection extends medially, potentially a small entero entero fistula. The cecum appears normal. No colonic inflammatory changes are identified. There is no evidence of acute appendiceal inflammation. There is a small appendicolith which appears unchanged.  Vascular/Lymphatic: Stable small reactive lymph nodes in the ileocolonic mesentery. There is  aortic and branch vessel atherosclerosis. No acute vascular findings are seen. Reproductive: The uterus and ovaries appear normal. Other: Stable small umbilical hernia containing only fat. No ascites or free air. Musculoskeletal: No acute or significant osseous findings. No evidence of sacroiliitis. IMPRESSION: 1. Persistent extensive inflammatory changes involving the terminal ileum consistent with active Crohn's disease. Small extraluminal tubular fluid collection may reflect a developing entero entero fistula. 2. No evidence of obstruction perforation. 3. Appendicolith without evidence of acute appendiceal inflammation. Electronically Signed   By: Richardean Sale M.D.   On: 11/29/2016 13:10      Assessment/Plan  HTN AKI  Crohn's disease, complicated by intra-abdominal abscesses, possible developing entero-enteric fistula - GI is following - Holding off on antibiotics at this time - Solu-Medrol - Humira every weekly per GI  - Patient symptoms have improved since admission. Will discuss with Dr. Ninfa Linden if surgery is indicated at this time.  We will continue to follow this patient. Thank you for the consult.  Kalman Drape, [facility] Surgery 11/30/2016, 2:52 PM Pager: [phone] Consults: [phone] Mon-Fri 7:00 am-4:30 pm Sat-Sun 7:00 am-11:30 am

## 2016-11-30 NOTE — Progress Notes (Signed)
Daily Rounding Note  11/30/2016, 12:23 PM  LOS: 1 day   SUBJECTIVE:   Chief complaint:  Abdominal pain, now resolved.  Has not taken, been given any anaelgesics so far Malaise improved.  Brown stool this AM.    Tolerating clears, no n/v.    OBJECTIVE:         Vital signs in last 24 hours:    Temp:  [98 F (36.7 C)-99.8 F (37.7 C)] 98.6 F (37 C) (02/27 1000) Pulse Rate:  [61-80] 62 (02/27 1000) Resp:  [16-20] 20 (02/27 1000) BP: (103-125)/(46-96) 118/57 (02/27 1000) SpO2:  [98 %-100 %] 100 % (02/27 1000) Weight:  [70.3 kg (155 lb)] 70.3 kg (155 lb) (02/26 2151) Last BM Date: 11/30/16 Filed Weights   11/29/16 2151  Weight: 70.3 kg (155 lb)   General: looks well.  comfortable   Heart: RRR Chest: clear bil.   Abdomen: soft, NT, ND.  BS reduced but not tinkling or tympanitic.    Extremities: no CCE Neuro/Psych:  Oriented x 3.  No gross deficits.    Intake/Output from previous day: 02/26 0701 - 02/27 0700 In: 2000 [IV Piggyback:2000] Out: 0   Intake/Output this shift: Total I/O In: 360 [P.O.:360] Out: -   Lab Results:  Recent Labs  11/29/16 1109 11/30/16 0742  WBC 6.8 3.1*  HGB 12.7 12.1  HCT 39.1 38.2  PLT 254 255   BMET  Recent Labs  11/29/16 1109 11/30/16 0742  NA 137 142  K 3.0* 4.6  CL 99* 111  CO2 26 22  GLUCOSE 103* 123*  BUN 14 11  CREATININE 1.17* 0.86  CALCIUM 9.3 9.3   LFT  Recent Labs  11/29/16 1109  PROT 6.6  ALBUMIN 3.4*  AST 17  ALT 13*  ALKPHOS 49  BILITOT 0.9   PT/INR No results for input(s): LABPROT, INR in the last 72 hours. Hepatitis Panel No results for input(s): HEPBSAG, HCVAB, HEPAIGM, HEPBIGM in the last 72 hours.  Studies/Results: Ct Abdomen Pelvis W Contrast  Result Date: 11/29/2016 CLINICAL DATA:  Abdominal pain with nausea and vomiting. History of Crohn's disease. EXAM: CT ABDOMEN AND PELVIS WITH CONTRAST TECHNIQUE: Multidetector CT imaging of  the abdomen and pelvis was performed using the standard protocol following bolus administration of intravenous contrast. CONTRAST:  186m ISOVUE-300 IOPAMIDOL (ISOVUE-300) INJECTION 61% COMPARISON:  CT 11/02/2016 and 10/12/2016. FINDINGS: Lower chest: Clear lung bases. Small pericardial effusion appears unchanged. There is no pleural effusion. Hepatobiliary: The liver appears normal. No evidence of gallstones, gallbladder wall thickening or biliary dilatation. Pancreas: Unremarkable. No pancreatic ductal dilatation or surrounding inflammatory changes. Spleen: Normal in size without focal abnormality. Adrenals/Urinary Tract: Both adrenal glands appear normal. There are stable tiny low-density renal lesions bilaterally, likely cysts. No evidence of enhancing renal mass, urinary tract calculus or hydronephrosis. The bladder appears normal. Stomach/Bowel: There are persistent extensive inflammatory changes involving the terminal ileum. There is associated wall thickening and surrounding inflammation. A small tubular fluid and air collection extends medially, potentially a small entero entero fistula. The cecum appears normal. No colonic inflammatory changes are identified. There is no evidence of acute appendiceal inflammation. There is a small appendicolith which appears unchanged. Vascular/Lymphatic: Stable small reactive lymph nodes in the ileocolonic mesentery. There is aortic and branch vessel atherosclerosis. No acute vascular findings are seen. Reproductive: The uterus and ovaries appear normal. Other: Stable small umbilical hernia containing only fat. No ascites or free air. Musculoskeletal: No acute or  significant osseous findings. No evidence of sacroiliitis. IMPRESSION: 1. Persistent extensive inflammatory changes involving the terminal ileum consistent with active Crohn's disease. Small extraluminal tubular fluid collection may reflect a developing entero entero fistula. 2. No evidence of obstruction  perforation. 3. Appendicolith without evidence of acute appendiceal inflammation. Electronically Signed   By: Richardean Sale M.D.   On: 11/29/2016 13:10    ASSESMENT:   *  Complicated Crohn's ileo-colitis. 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  On going Humira q week.     PLAN   *  Dr Hilarie Fredrickson called [facility] for surgical consult.   Inclined to advance to soft diet but wait for surgery consult and for d/w Dr Hilarie Fredrickson.     [name]  11/30/2016, 12:23 PM Pager: [phone]

## 2016-11-30 NOTE — Progress Notes (Signed)
Initial Nutrition Assessment  DOCUMENTATION CODES:   Non-severe (moderate) malnutrition in context of chronic illness  INTERVENTION:  Provide Ensure Enlive po BID, each supplement provides 350 kcal and 20 grams of protein.  Encourage adequate PO intake.   NUTRITION DIAGNOSIS:   Malnutrition (moderate) related to chronic illness as evidenced by percent weight loss, mild depletion of muscle mass.  GOAL:   Patient will meet greater than or equal to 90% of their needs  MONITOR:   PO intake, Supplement acceptance, Diet advancement, Labs, Weight trends, Skin, I & O's  REASON FOR ASSESSMENT:   Malnutrition Screening Tool    ASSESSMENT:   61 yo F with a pmhx of ileocolonic and perianal Crohn's disease complicated by intraabdominal abscesses who presented with worsening nausea and abdominal pain, now with imaging concerning for a developing entero-enteric fistula.  Diet has been advanced to a full liquid diet. Pt reports no longer having abdominal pains. Pt reports eating well PTA with usual consumption of at least 3 meals a day. Pt reports consuming all of her food on her breakfast tray except the broth due to dislike of taste. Pt reports having a 15 lb weight loss over the past 1 month due to having a virus acute illness a couple of weeks ago. Pt with a reported 8.8% weight loss in 1 month. Pt is agreeable to nutritional supplements to aid in adequate nutrition. RD to order. Pt and husband at bedside requested information regarding a low fiber diet and appropriate protein shakes for home. Information was discussed and given. Pt and husband expressed understanding of information.   Nutrition-Focused physical exam completed. Findings are no fat depletion, mild muscle depletion, and no edema.   Labs and medications reviewed.   Diet Order:  Diet full liquid Room service appropriate? Yes; Fluid consistency: Thin  Skin:  Reviewed, no issues  Last BM:  2/27  Height:   Ht Readings from  Last 1 Encounters:  11/26/16 5' 2.5" (1.588 m)    Weight:   Wt Readings from Last 1 Encounters:  11/29/16 155 lb (70.3 kg)    Ideal Body Weight:  51 kg  BMI:  Body mass index is 27.9 kg/m.  Estimated Nutritional Needs:   Kcal:  3762-8315  Protein:  80-90 grams  Fluid:  1.7 - 1.9 L/day  EDUCATION NEEDS:   Education needs addressed  Corrin Parker, MS, RD, LDN Pager # [phone] After hours/ weekend pager # [phone]

## 2016-11-30 NOTE — Progress Notes (Signed)
Patient tolerated breakfast and lunch on clear liquid diet well. Diet advanced to a full liquid diet per order. Will continue to monitor.

## 2016-11-30 NOTE — Progress Notes (Signed)
Verbal order to advance diet as tolerated. NPO diet D/C'd and clear liquid diet order placed.

## 2016-12-01 LAB — BASIC METABOLIC PANEL
ANION GAP: 6 (ref 5–15)
BUN: 11 mg/dL (ref 6–20)
CALCIUM: 9.2 mg/dL (ref 8.9–10.3)
CHLORIDE: 109 mmol/L (ref 101–111)
CO2: 24 mmol/L (ref 22–32)
Creatinine, Ser: 0.79 mg/dL (ref 0.44–1.00)
GFR calc non Af Amer: >60 mL/min (ref >60)
GLUCOSE: 111 mg/dL — AB (ref 65–99)
Potassium: 4.1 mmol/L (ref 3.5–5.1)
Sodium: 139 mmol/L (ref 135–145)

## 2016-12-01 MED ORDER — PREDNISONE 20 MG PO TABS
40.0000 mg | ORAL_TABLET | Freq: Every day | ORAL | Status: DC
  Administered 2016-12-01 – 2016-12-02 (×2): 40 mg via ORAL
  Filled 2016-12-01 (×2): qty 2

## 2016-12-01 NOTE — Progress Notes (Signed)
Daily Rounding Note  12/01/2016, 9:59 AM  LOS: 2 days   SUBJECTIVE:   Chief complaint: anxiety that oral abx will cause pain and nausea.      Absolutely no pain or nausea since admission.  Stools soft, brown and3 x today.  Can not recall how many stools or what they looked like yesterday.  OBJECTIVE:         Vital signs in last 24 hours:    Temp:  [98 F (36.7 C)-98.8 F (37.1 C)] 98 F (36.7 C) (02/28 0536) Pulse Rate:  [55-62] 55 (02/28 0536) Resp:  [18-20] 18 (02/28 0536) BP: (118-135)/(47-57) 135/53 (02/28 0536) SpO2:  [99 %-100 %] 99 % (02/28 0536) Weight:  [71.3 kg (157 lb 3.2 oz)] 71.3 kg (157 lb 3.2 oz) (02/27 2052) Last BM Date: 11/30/16 Filed Weights   11/29/16 2151 11/30/16 2052  Weight: 70.3 kg (155 lb) 71.3 kg (157 lb 3.2 oz)   General: comfortable.  Looks well   Heart: RRR Chest: clear bil Abdomen: soft, NT, ND.  Active BS  Extremities: no CCE Neuro/Psych:  Alert, oriented x 3.  Poor overall memory.  No weakness or tremor.   Intake/Output from previous day: 02/27 0701 - 02/28 0700 In: 1828.8 [P.O.:1080; I.V.:748.8] Out: 726 [Urine:725; Stool:1]  Intake/Output this shift: Total I/O In: 360 [P.O.:360] Out: 300 [Urine:300]  Lab Results:  Recent Labs  11/29/16 1109 11/30/16 0742  WBC 6.8 3.1*  HGB 12.7 12.1  HCT 39.1 38.2  PLT 254 255   BMET  Recent Labs  11/29/16 1109 11/30/16 0742 12/01/16 0525  NA 137 142 139  K 3.0* 4.6 4.1  CL 99* 111 109  CO2 26 22 24   GLUCOSE 103* 123* 111*  BUN 14 11 11   CREATININE 1.17* 0.86 0.79  CALCIUM 9.3 9.3 9.2   LFT  Recent Labs  11/29/16 1109  PROT 6.6  ALBUMIN 3.4*  AST 17  ALT 13*  ALKPHOS 49  BILITOT 0.9   PT/INR No results for input(s): LABPROT, INR in the last 72 hours. Hepatitis Panel No results for input(s): HEPBSAG, HCVAB, HEPAIGM, HEPBIGM in the last 72 hours.  Studies/Results: Ct Abdomen Pelvis W Contrast  Result  Date: 11/29/2016 CLINICAL DATA:  Abdominal pain with nausea and vomiting. History of Crohn's disease. EXAM: CT ABDOMEN AND PELVIS WITH CONTRAST TECHNIQUE: Multidetector CT imaging of the abdomen and pelvis was performed using the standard protocol following bolus administration of intravenous contrast. CONTRAST:  180m ISOVUE-300 IOPAMIDOL (ISOVUE-300) INJECTION 61% COMPARISON:  CT 11/02/2016 and 10/12/2016. FINDINGS: Lower chest: Clear lung bases. Small pericardial effusion appears unchanged. There is no pleural effusion. Hepatobiliary: The liver appears normal. No evidence of gallstones, gallbladder wall thickening or biliary dilatation. Pancreas: Unremarkable. No pancreatic ductal dilatation or surrounding inflammatory changes. Spleen: Normal in size without focal abnormality. Adrenals/Urinary Tract: Both adrenal glands appear normal. There are stable tiny low-density renal lesions bilaterally, likely cysts. No evidence of enhancing renal mass, urinary tract calculus or hydronephrosis. The bladder appears normal. Stomach/Bowel: There are persistent extensive inflammatory changes involving the terminal ileum. There is associated wall thickening and surrounding inflammation. A small tubular fluid and air collection extends medially, potentially a small entero entero fistula. The cecum appears normal. No colonic inflammatory changes are identified. There is no evidence of acute appendiceal inflammation. There is a small appendicolith which appears unchanged. Vascular/Lymphatic: Stable small reactive lymph nodes in the ileocolonic mesentery. There is aortic and branch vessel atherosclerosis. No  acute vascular findings are seen. Reproductive: The uterus and ovaries appear normal. Other: Stable small umbilical hernia containing only fat. No ascites or free air. Musculoskeletal: No acute or significant osseous findings. No evidence of sacroiliitis. IMPRESSION: 1. Persistent extensive inflammatory changes involving the  terminal ileum consistent with active Crohn's disease. Small extraluminal tubular fluid collection may reflect a developing entero entero fistula. 2. No evidence of obstruction perforation. 3. Appendicolith without evidence of acute appendiceal inflammation. Electronically Signed   By: Richardean Sale M.D.   On: 11/29/2016 13:10   Scheduled Meds: . ampicillin-sulbactam (UNASYN) IV  1.5 g Intravenous Q6H  . enoxaparin (LOVENOX) injection  40 mg Subcutaneous Q24H  . feeding supplement (ENSURE ENLIVE)  237 mL Oral BID BM  . methylPREDNISolone (SOLU-MEDROL) injection  20 mg Intravenous Q8H   Continuous Infusions: . sodium chloride 1,000 mL (12/01/16 0806)   PRN Meds:.ondansetron **OR** ondansetron (ZOFRAN) IV  ASSESMENT:   * Complicated Crohn's ileo-colitis. 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 On going Humira q week. Intolerable abd pain PTA resolved quickly after admission.  Still has not required any pain meds. Day 3 Unasyn and Solumedrol.  Pain remains completely resolved.   No plans for immediate surgery but now established with [facility], Dr Aris Lot. Per Dr Excell Seltzer "would be best served with minimizing her immunosuppressive medications and planned elective surgery with a bowel prep".   PLAN   *  Advance diet to soft .  Stop solumedrol.  Start oral prednisone today, 40 mg/day.  Leave Unasyn in place.   ? Resume Humira per weekly schedule.       [name]  12/01/2016, 9:59 AM Pager: [phone]

## 2016-12-01 NOTE — Progress Notes (Signed)
Patients unasyn dose times were not on schedule. Pharmacist consulted. Pharmacy stated to give 0930 dose, do not give 1530 dose, and give 1800 dose to get the patient back on schedule. Instructions followed. Will continue to monitor.

## 2016-12-01 NOTE — Progress Notes (Addendum)
   Subjective: Patient is doing well today, no events overnight. Reports minimal nausea this morning after attempting to eat without Zofran. Denies pain. She reports 4 normal BM today, no blood in her stool. Tolerating liquid diet.  Objective:  Vital signs in last 24 hours: Vitals:   11/30/16 1857 11/30/16 2052 12/01/16 0536 12/01/16 1017  BP: (!) 122/47 (!) 128/47 (!) 135/53 (!) 134/48  Pulse: (!) 56 (!) 56 (!) 55 72  Resp: 18 18 18    Temp: 98.8 F (37.1 C) 98.1 F (36.7 C) 98 F (36.7 C) 98 F (36.7 C)  TempSrc: Oral Oral Oral Oral  SpO2: 100% 99% 99% 97%  Weight:  157 lb 3.2 oz (71.3 kg)    Height:  5' 2"  (1.575 m)     Physical Exam Constitutional: NAD, appears comfortable Cardiovascular: RRR, no murmurs, rubs, or gallops.  Pulmonary/Chest: CTAB, no wheezes, rales, or rhonchi.  Abdominal: Soft, non tender, non distended. +BS. Mildly tender to palpation over suprapubic region. No rebound or guarding.  Extremities: Warm and well perfused. Distal pulses intact. No edema.  Neurological: A&Ox3, CN II - XII grossly intact.    Assessment/Plan:  Patient is a 61 yo F with a pmhx of ileocolonic and perianal Crohn's disease complicated by intraabdominal abscesses who presented with worsening nausea and abdominal pain, now with imaging concerning for a developing entero-enteric fistula.   Crohn's Ileocolitis: Complicated by intraabdominal abscesses and CT concerning for entero-enteric fistula. Patient recently completed a long course of antibiotics that were started in January of 2018. Pain and nausea are well controlled. She has remained afebrile and symptoms have improved with antibiotics and solumedrol. General surgery was consulted yesterday per GI, planning for elective surgery with bowel prep once patient is weaned off steroids and immunosuppression held for 2 weeks.  -- GI following; appreciate recommendations.  -- Unasyn and Solumedrol per GI  -- Humira q weekly per GI  --  Zofran prn for nausea  -- Patient declined pain medication  -- Advance diet as tolerated  AKI: Resolved s/p 2L bolus in ED and NS @ 100 overnight. Likely pre-renal from poor po intake.  -- Stop fluids -- Holding home antihypertensives   HTN: Currently normotensive. Patient takes olmesartan-amlodipine-hctz 40-5-12.5 mg at home. Held on admission due to AKI. -- Monitor vitals  -- Hold home antihypertensives for now   FEN: No fluids, replete lytes prn, advance diet as tolerated  VTE ppx: Lovenox  Code Status: FULL   Dispo: Anticipated discharge pending GI recommendations.   Velna Ochs, MD 12/01/2016, 10:37 AM Pager: [phone]

## 2016-12-01 NOTE — Final Consult Note (Signed)
Consultant Final Sign-Off Note    Assessment/Final recommendations  Donna Richmond is a 61 y.o. female followed by me for Crohn's ileitis with small abscesses/walled off perforations. We were consulted to discuss small bowel resection of the affected segment of bowel. No surgery at this time. We believe pt would be best served with minimizing her immunosuppressive medications and planned elective surgery with a bowel prep.    Wound care (if applicable): N/A   Diet at discharge: per primary team   Activity at discharge: per primary team   Follow-up appointment:  Pt will schedule an appointment to see Dr. Excell Seltzer in 2-3 weeks   Pending results:  Unresulted Labs    Start     Ordered   12/06/16 0500  Creatinine, serum  (enoxaparin (LOVENOX)    CrCl >/= 30 ml/min)  Weekly,   R    Comments:  while on enoxaparin therapy    11/29/16 1603       Medication recommendations:   Other recommendations:    Thank you for allowing [place] to participate in the care of your patient!  Please consult [place] again if you have further needs for your patient.  Kalman Drape 12/01/2016 9:23 AM    Subjective   Pt is not having any abdominal pain. She had a little nausea that was controlled with medication. She is still having normal BM's that are small but formed. No new complaints.  Objective  Vital signs in last 24 hours: Temp:  [98 F (36.7 C)-98.8 F (37.1 C)] 98 F (36.7 C) (02/28 0536) Pulse Rate:  [55-62] 55 (02/28 0536) Resp:  [18-20] 18 (02/28 0536) BP: (118-135)/(47-57) 135/53 (02/28 0536) SpO2:  [99 %-100 %] 99 % (02/28 0536) Weight:  [157 lb 3.2 oz (71.3 kg)] 157 lb 3.2 oz (71.3 kg) (02/27 2052)  General: Constitutional: She is oriented to person, place, and time and well-developed, well-nourished, and in no distress. Vital signs are normal. No distress. Well-appearing white female  Cardiovascular: RRR, normal heart sounds.  Exam reveals no gallop, friction rub. No murmur  heard. Pulmonary/Chest: Effort and rate normal. No respiratory distress.  Abdominal: Soft. Bowel sounds are normal. She exhibits no distension and no mass. There is no tenderness. There is no rigidity, no rebound and no guarding.  Neurological: She is alert and oriented to person, place, and time.  Skin: Skin is warm and dry. No rash noted. She is not diaphoretic.  Psychiatric: Mood and affect normal.  Nursing note and vitals reviewed.   Pertinent labs and Studies:  Recent Labs  11/29/16 1109 11/30/16 0742  WBC 6.8 3.1*  HGB 12.7 12.1  HCT 39.1 38.2   BMET  Recent Labs  11/30/16 0742 12/01/16 0525  NA 142 139  K 4.6 4.1  CL 111 109  CO2 22 24  GLUCOSE 123* 111*  BUN 11 11  CREATININE 0.86 0.79  CALCIUM 9.3 9.2   No results for input(s): LABURIN in the last 72 hours. Results for orders placed or performed in visit on 08/23/16  Clostridium Difficile by PCR     Status: Abnormal   Collection Time: 08/23/16  8:44 AM  Result Value Ref Range Status   Toxigenic C Difficile by pcr Positive (A) Negative Final    Comment: Toxigenic C difficile: Positive Epidemic Strain Bl/NAP1/027: Presumptive Negative     Imaging: No results found.  Jackson Latino, [facility] Surgery Pager [phone]

## 2016-12-02 ENCOUNTER — Telehealth: Payer: Self-pay

## 2016-12-02 DIAGNOSIS — K50013 Crohn's disease of small intestine with fistula: Principal | ICD-10-CM

## 2016-12-02 DIAGNOSIS — E440 Moderate protein-calorie malnutrition: Secondary | ICD-10-CM

## 2016-12-02 MED ORDER — PREDNISONE 5 MG PO TABS: 40.0000 mg | ORAL_TABLET | Freq: Every day | ORAL | 0 refills | Status: DC

## 2016-12-02 MED ORDER — LEVOFLOXACIN 500 MG PO TABS: 500.0000 mg | ORAL_TABLET | Freq: Every day | ORAL | Status: DC

## 2016-12-02 MED ORDER — LEVOFLOXACIN 500 MG PO TABS: 500.0000 mg | ORAL_TABLET | Freq: Every day | ORAL | 0 refills | Status: AC

## 2016-12-02 NOTE — Progress Notes (Signed)
Daily Rounding Note  12/02/2016, 9:29 AM  LOS: 3 days   SUBJECTIVE:   Chief complaint: abdominal pain, remains resolved.  Eager to discharge home.  Asking about starting back on BP meds which are on hold due to soft BPs.  .     OBJECTIVE:         Vital signs in last 24 hours:    Temp:  [97.6 F (36.4 C)-98.2 F (36.8 C)] 98.2 F (36.8 C) (03/01 0521) Pulse Rate:  [52-72] 64 (03/01 0521) Resp:  [19-20] 19 (03/01 0521) BP: (119-134)/(48-65) 122/65 (03/01 0521) SpO2:  [97 %-100 %] 100 % (03/01 0521) Weight:  [71 kg (156 lb 8.4 oz)] 71 kg (156 lb 8.4 oz) (02/28 2052) Last BM Date: 12/01/16 Filed Weights   11/29/16 2151 11/30/16 2052 12/01/16 2052  Weight: 70.3 kg (155 lb) 71.3 kg (157 lb 3.2 oz) 71 kg (156 lb 8.4 oz)   General: looks well   Heart: RRR Chest: clear bil Abdomen: soft, NT, ND.  Active BS  Extremities: no CCE Neuro/Psych:  Fully alert and oriented, no tremor.  Mildly anxious.     Intake/Output from previous day: 02/28 0701 - 03/01 0700 In: 3727.5 [P.O.:2100; I.V.:1527.5; IV Piggyback:100] Out: 501 [Urine:501]  Intake/Output this shift: No intake/output data recorded.  Lab Results:  Recent Labs  11/29/16 1109 11/30/16 0742  WBC 6.8 3.1*  HGB 12.7 12.1  HCT 39.1 38.2  PLT 254 255   BMET  Recent Labs  11/29/16 1109 11/30/16 0742 12/01/16 0525  NA 137 142 139  K 3.0* 4.6 4.1  CL 99* 111 109  CO2 26 22 24   GLUCOSE 103* 123* 111*  BUN 14 11 11   CREATININE 1.17* 0.86 0.79  CALCIUM 9.3 9.3 9.2   LFT  Recent Labs  11/29/16 1109  PROT 6.6  ALBUMIN 3.4*  AST 17  ALT 13*  ALKPHOS 49  BILITOT 0.9   PT/INR No results for input(s): LABPROT, INR in the last 72 hours. Hepatitis Panel No results for input(s): HEPBSAG, HCVAB, HEPAIGM, HEPBIGM in the last 72 hours.  Studies/Results: No results found.  Scheduled Meds: . ampicillin-sulbactam (UNASYN) IV  1.5 g Intravenous Q6H  .  enoxaparin (LOVENOX) injection  40 mg Subcutaneous Q24H  . feeding supplement (ENSURE ENLIVE)  237 mL Oral BID BM  . predniSONE  40 mg Oral QAC breakfast   Continuous Infusions: . sodium chloride 75 mL/hr at 12/01/16 2053   PRN Meds:.ondansetron **OR** ondansetron (ZOFRAN) IV  ASSESMENT:   * Complicated Crohn's ileo-colitis. Long course of abx for contained perf/abscess started early 10/2016, now with what appears to be entero-enteric fistula. Ongoing Humira q week (next dose due 3/9). Intolerable abd pain PTA resolved quickly after admission.  Still has not required any pain meds. Day 4 Unasyn and steroids (Solumedrol>> prednisone)   Pain remains completely resolved.   No plans for immediate surgery but now established with [facility], Dr Aris Lot. Per Dr Excell Seltzer "would be best served with minimizing her immunosuppressive medications and planned elective surgery with a bowel prep".    PLAN   *  Home today. Has GI ROV with Dr. Hilarie Fredrickson on 4/17.  She knows to call the office if she needs to be seen sooner.  *  Slow prednosone taper, reduce dose by 5 mg weekly.  10 more days abx: Levaquin (I ordered the Levaquin today and stopped the Unasyn).  Low fiber diet.  Elective ileocecectomy, follow up with  Surgery as outpt.    *  Regarding Humira.  Next dose due 3/9.  At this point whether or not she takes this dose is still up in the air. Dr. Hilarie Fredrickson will let Mrs. Whiters know whether or not to take or hold any future doses of Humira once timing of surgical appointment is determined.   [name]  12/02/2016, 9:29 AM Pager: [phone]

## 2016-12-02 NOTE — Telephone Encounter (Signed)
Pt scheduled to see Dr. Drue Flirt at the [address] location 12/31/16@9am . [facility] to mail pt directions and information regarding the appt. Records faxed to [phone]. Pts appt with [facility] cancelled per Dr. Hilarie Fredrickson.

## 2016-12-02 NOTE — Discharge Summary (Signed)
Name: Donna Richmond MRN: [id number] DOB: 06-29-1956 61 y.o. PCP: Marco Collie, MD  Date of Admission: 11/29/2016 11:10 AM Date of Discharge: 12/02/2016 Attending Physician: Annia Belt, MD  Discharge Diagnosis: Principal Problem:   Crohn's colitis, with fistula ([place]) Active Problems:   Malnutrition of moderate degree   Discharge Medications: Allergies as of 12/02/2016      Reactions   Biaxin [clarithromycin] Hives      Medication List    STOP taking these medications   Adalimumab 40 MG/0.8ML Pnkt Commonly known as:  HUMIRA PEN   Olmesartan-Amlodipine-HCTZ 40-5-12.5 MG Tabs     TAKE these medications   levofloxacin 500 MG tablet Commonly known as:  LEVAQUIN Take 1 tablet (500 mg total) by mouth daily.   predniSONE 5 MG tablet Commonly known as:  DELTASONE Take 8 tablets (40 mg total) by mouth daily before breakfast. Take 58m for 7 days, then decrease by 577mevery 7 days, then stop. Start taking on:  12/03/2016   vitamin B-12 1000 MCG tablet Commonly known as:  CYANOCOBALAMIN Take 1,000 mcg by mouth daily.       Disposition and follow-up:   Ms.Donna M KRISTELLE [facility]  At the hospital follow up visit please address:  1.  Crohn's Disease: finished Abx course, compliant with steroid taper? Follow-up with General Surgery and GI established? Plan established for Humira? Hypertension: BP med held on DC. F/u with home pressures and resume/titrate as appropriate.  Follow-up Appointments: Follow-up Information    HOXWORTH,BENJAMIN T, MD. Schedule an appointment as soon as possible for a visit in 3 week(s).   Specialty:  General Surgery Why:  for follow up  Contact information: [address] [place] [phone]        HODGES,BETH, MD[place]faxn 12/14/2016.   Specialty:  Family Medicine Why:  Appt at 5:20pm. Please call your doctor if you notice that your blood pressure  readings are going up before this appointment. Contact information: [phone]        [facility]. Go to.   Why:  Your currently scheduled appointment on 4/17.         Hospital Course by problem list: Principal Problem:   [place] Problems:   Malnutrition of moderate degree   1. Crohn's ileitis with fistula: Pt has longstanding h/o Crohn's dz on Humira complicated by recurrent abcesses who presented with Increasing abdominal pain, nausea, chills, subjective fevers. She was started on antibiotics intravenously and a course of steroids was initiated. There was initial concern for recurrent abscess, she had a CT abdomen and pelvis which did not show any evidence of intra-abdominal infection, but did show evidence of new fistula. She was evaluated by gastroenterology who recommended general surgical consult for operative management of her new fistula. Surgery saw the patient and recommended outpatient follow-up after the patient has been tapered off of her steroids and other immunosuppressants and has finished her antibiotic course. Patient will be discharged on a ten-day course of levofloxacin, and had a slow but does sound taper starting at 40 mg and tapering by 5 mg weekly. She has follow-up established with general surgery, as well as with gastroenterology. Gastroenterology is planning to determine whether to continue her Humira at a later date.  2. Hypertension: Patient's blood pressure was normotensive during her admission and her home blood pressure medication was held. This  medication was held on discharge with a plan to follow-up with her PCP for further management.  Discharge Vitals:   BP 117/87 (BP Location: Right Arm)   Pulse 96   Temp 97.9 F (36.6 C) (Oral)   Resp 20   Ht 5' 2"  (1.575 m)   Wt 156 lb 8.4 oz (71 kg)   SpO2 97%   BMI 28.63 kg/m   Pertinent Labs, Studies, and  Procedures: As below.  Procedures Performed:  Ct Abdomen Pelvis W Contrast  Result Date: 11/29/2016 CLINICAL DATA:  Abdominal pain with nausea and vomiting. History of Crohn's disease. EXAM: CT ABDOMEN AND PELVIS WITH CONTRAST TECHNIQUE: Multidetector CT imaging of the abdomen and pelvis was performed using the standard protocol following bolus administration of intravenous contrast. CONTRAST:  146m ISOVUE-300 IOPAMIDOL (ISOVUE-300) INJECTION 61% COMPARISON:  CT 11/02/2016 and 10/12/2016. FINDINGS: Lower chest: Clear lung bases. Small pericardial effusion appears unchanged. There is no pleural effusion. Hepatobiliary: The liver appears normal. No evidence of gallstones, gallbladder wall thickening or biliary dilatation. Pancreas: Unremarkable. No pancreatic ductal dilatation or surrounding inflammatory changes. Spleen: Normal in size without focal abnormality. Adrenals/Urinary Tract: Both adrenal glands appear normal. There are stable tiny low-density renal lesions bilaterally, likely cysts. No evidence of enhancing renal mass, urinary tract calculus or hydronephrosis. The bladder appears normal. Stomach/Bowel: There are persistent extensive inflammatory changes involving the terminal ileum. There is associated wall thickening and surrounding inflammation. A small tubular fluid and air collection extends medially, potentially a small entero entero fistula. The cecum appears normal. No colonic inflammatory changes are identified. There is no evidence of acute appendiceal inflammation. There is a small appendicolith which appears unchanged. Vascular/Lymphatic: Stable small reactive lymph nodes in the ileocolonic mesentery. There is aortic and branch vessel atherosclerosis. No acute vascular findings are seen. Reproductive: The uterus and ovaries appear normal. Other: Stable small umbilical hernia containing only fat. No ascites or free air. Musculoskeletal: No acute or significant osseous findings. No evidence  of sacroiliitis. IMPRESSION: 1. Persistent extensive inflammatory changes involving the terminal ileum consistent with active Crohn's disease. Small extraluminal tubular fluid collection may reflect a developing entero entero fistula. 2. No evidence of obstruction perforation. 3. Appendicolith without evidence of acute appendiceal inflammation. Electronically Signed   By: WRichardean SaleM.D.   On: 11/29/2016 13:10    Consultations: JJerene Bears MD - Gastroenterology General Surgery  Discharge Instructions: Discharge Instructions    Call MD for:  persistant nausea and vomiting    Complete by:  As directed    Call MD for:  severe uncontrolled pain    Complete by:  As directed    Call MD for:  temperature >100.4    Complete by:  As directed    Diet - low sodium heart healthy    Complete by:  As directed    Discharge instructions    Complete by:  As directed    You should have follow-up scheduled with the general surgery team in about 3 weeks. Please call to schedule this appointment after you leaves the hospital using the number provided. Your gastroenterologist will be in touch with you regarding whether to continue your Humira treatments, please contact them regarding this if you have not heard from them before your next treatment is due on March 9. You are to have a follow-up appointment scheduled in April please keep this appointment, if you have concerns before then please call to have this appointment moved up. We will continue 10 days of antibiotics  which you can take orally, you will be on a medication called levofloxacin. We will also have a slow taper of prednisone. You will start with 40 mg daily for one week, and he will decrease this by 5 mg every week until finished.  Your blood pressures have been stable in the hospital without your blood pressure medication. These continue to hold this medication after you leave until you follow-up with your primary care provider. We have made  this appointment for you. Please check your blood pressure at home and if you notice that her blood pressure is beginning to go up please call your primary doctor for further advice.   Increase activity slowly    Complete by:  As directed       Signed: Holley Raring, MD 12/02/2016, 11:19 AM   Pager: [phone]

## 2016-12-02 NOTE — Progress Notes (Addendum)
   Subjective: Pt is doing well. No complaint. Ready for discharge.  Objective:  Vital signs in last 24 hours: Vitals:   12/01/16 1017 12/01/16 2052 12/02/16 0521 12/02/16 1000  BP: (!) 134/48 (!) 119/51 122/65 117/87  Pulse: 72 (!) 52 64 96  Resp:  20 19 20   Temp: 98 F (36.7 C) 97.6 F (36.4 C) 98.2 F (36.8 C) 97.9 F (36.6 C)  TempSrc: Oral Oral Oral Oral  SpO2: 97% 97% 100% 97%  Weight:  156 lb 8.4 oz (71 kg)    Height:       Physical Exam Constitutional: NAD, appears comfortable Cardiovascular: RRR, no murmurs, rubs, or gallops.  Pulmonary/Chest: CTAB, no wheezes, rales, or rhonchi.  Abdominal: Soft, non tender, non distended. +BS. No sig TTP Extremities: Warm and well perfused. Distal pulses intact. No edema.  Neurological: A&Ox3, CN II - XII grossly intact.   Assessment/Plan:  Patient is a 61 yo F with a pmhx of ileocolonic and perianal Crohn's disease complicated by intraabdominal abscesses who presented with worsening nausea and abdominal pain, now with imaging concerning for a developing entero-enteric fistula.   Crohn's Ileocolitis: Complicated by intraabdominal abscesses and CT concerning for entero-enteric fistula. Patient recently completed a long course of antibiotics that were started in January of 2018. Pain and nausea are well controlled. She has remained afebrile and symptoms have improved with antibiotics and solumedrol. General surgery was consulted per GI, planning for elective surgery with bowel prep once patient is weaned off steroids and immunosuppression held for 2 weeks.  -- GI following; appreciate recommendations.  -- Unasyn and Solumedrol per GI  -- Plan pending to cont/discontinue Humira on 3/9, GI to clarify w/ the pt today. -- Zofran prn for nausea  -- Patient declined pain medication  -- Advance diet as tolerated  AKI: Resolved s/p 2L bolus in ED and NS @ 100 overnight. Likely pre-renal from poor po intake.  -- Holding home  antihypertensives   HTN: Currently normotensive. Patient takes olmesartan-amlodipine-hctz 40-5-12.5 mg at home. BPs have been stable and normal during admission. Will plan to hold on DC and f/u w/ PCP. -- Monitor vitals  -- Hold home antihypertensives for now   FEN: No fluids, replete lytes prn, advance diet as tolerated  VTE ppx: Lovenox  Code Status: FULL   Dispo: Anticipated discharge today.  Holley Raring, MD 12/02/2016, 10:58 AM Pager: [phone]  Internal Medicine Attending:   I saw and examined the patient. I reviewed the resident's note and I agree with the resident's findings and plan as documented above.  She may continue to hold her home antihypertensives until she follows up next week with her PCP.  BP is well controlled here and she has a home BP cuff to monitor at home.  GI has changed Abx to levaquin and plan to continue 10 day course.  Prednisone taper as directed by GI.  Dr Hilarie Fredrickson will decided if she gets next dose of humira depending on timing of planned surgery. Lucious Groves, DO  12/02/16 11:36 AM

## 2016-12-06 ENCOUNTER — Other Ambulatory Visit: Payer: BLUE CROSS/BLUE SHIELD

## 2016-12-26 ENCOUNTER — Other Ambulatory Visit: Payer: Self-pay | Admitting: Internal Medicine

## 2017-01-06 ENCOUNTER — Encounter: Payer: Self-pay | Admitting: *Deleted

## 2017-01-06 HISTORY — PX: ILEOCECETOMY: SHX5857

## 2017-01-18 ENCOUNTER — Encounter: Payer: Self-pay | Admitting: Internal Medicine

## 2017-01-18 ENCOUNTER — Ambulatory Visit (INDEPENDENT_AMBULATORY_CARE_PROVIDER_SITE_OTHER): Payer: BLUE CROSS/BLUE SHIELD | Admitting: Internal Medicine

## 2017-01-18 VITALS — BP 118/64 | HR 72 | Ht 62.25 in | Wt 162.0 lb

## 2017-01-18 DIAGNOSIS — T814XXA Infection following a procedure, initial encounter: Secondary | ICD-10-CM

## 2017-01-18 DIAGNOSIS — R110 Nausea: Secondary | ICD-10-CM | POA: Diagnosis not present

## 2017-01-18 DIAGNOSIS — IMO0001 Reserved for inherently not codable concepts without codable children: Secondary | ICD-10-CM

## 2017-01-18 DIAGNOSIS — K50019 Crohn's disease of small intestine with unspecified complications: Secondary | ICD-10-CM | POA: Diagnosis not present

## 2017-01-18 MED ORDER — SULFAMETHOXAZOLE-TRIMETHOPRIM 800-160 MG PO TABS: 1.0000 | ORAL_TABLET | Freq: Two times a day (BID) | ORAL | 0 refills | Status: DC

## 2017-01-18 MED ORDER — ONDANSETRON 4 MG PO TBDP: 4.0000 mg | ORAL_TABLET | Freq: Four times a day (QID) | ORAL | 0 refills | Status: DC | PRN

## 2017-01-18 NOTE — Progress Notes (Signed)
Subjective:    Patient ID: Donna Richmond, female    DOB: 01/27/1956, 61 y.o.   MRN: [id number]  HPI Donna Richmond is a 61 year old female with a history of ileocolonic Crohn's disease and perianal Crohn's disease who is here for follow-up. She's here today with her daughter, Donna Richmond. She underwent laparoscopic ileocecectomy with Dr. Drue Flirt on 01/06/2017. She spent 5 days in the hospital and has been recovering at home. She is off of all of her IBD medications. She is only on B12 and her Benicar HCT.  She reports that over the last 48 hours she has noticed some increased tenderness and serous drainage from the superior aspect of her umbilicus incision. This is tender to touch. She's had some generalized abdominal soreness but not pain. Her daughter feels that she's been doing too much like cooking, cleaning, and activities around the house. She's been eating a soft diet such as potatoes and pasta. She's had no fevers. She has some nausea which comes and goes. No vomiting. No fevers. Bowel movements 2-3 times per day which are loose to mostly formed. Nonbloody and non-melenic. She is use Tylenol for pain but not narcotics since hospital discharge. She was given medication for diarrhea before hospital discharge which sounds like Imodium.  Review of Systems As per history of present illness, otherwise negative  Current Medications, Allergies, Past Medical History, Past Surgical History, Family History and Social History were reviewed in [employer] record.     Objective:   Physical Exam BP 118/64   Pulse 72   Ht 5' 2.25" (1.581 m)   Wt 162 lb (73.5 kg)   BMI 29.39 kg/m  Constitutional: Well-developed and well-nourished. No distress. HEENT: Normocephalic and atraumatic. Oropharynx is clear and moist. Conjunctivae are normal.  No scleral icterus. Neck: Neck supple. Trachea midline. Cardiovascular: Normal rate, regular rhythm and intact distal pulses. No  M/R/G Pulmonary/chest: Effort normal and breath sounds normal. No wheezing, rales or rhonchi. Abdominal: Soft, Vertical umbilicus incision with mild induration/firmness and tenderness at the superior aspect with no purulent drainage noted, nondistended. Remaining laparoscopic incisions are well healing. Bowel sounds active throughout. There are no masses palpable. Extremities: no clubbing, cyanosis, or edema Neurological: Alert and oriented to person place and time. Skin: Skin is warm and dry. Psychiatric: Normal mood and affect. Behavior is normal.  Pathology results reviewed after ileocecectomy consistent with Crohn's disease with non-caseating granulomas and fistula formation     Assessment & Plan:  61 year old female with a history of ileocolonic Crohn's disease and perianal Crohn's disease who is here for follow-up.   1. Ileal Crohn's disease with perianal involvement -- status post ileocecectomy 12 days ago. She appears to be doing well after surgery. There may be an early infection at the top part of her umbilicus incision. There is no evidence of dehiscence. See #2. --I will continue to hold Humira until cleared by Dr. Drue Flirt. She has surgical follow-up on 02/15/2017. Plan is to resume Humira 40 mg every 14 days once cleared from surgery.  2. Possible early wound infection -- as described above. Begin Bactrim double strength 1 tablet twice a day 7 days. I instructed her to notify me and Dr. Drue Flirt if symptoms such as drainage, pain and redness worsen or do not improve with Bactrim. She voiced understanding.  3. Nausea -- Zofran 4 mg every 6 hours as needed  Return in early June, sooner if necessary 25 minutes spent with the patient today. Greater than 50% was  spent in counseling and coordination of care with the patient

## 2017-01-18 NOTE — Patient Instructions (Addendum)
We have sent the following medications to your pharmacy for you to pick up at your convenience: Bactrium DS twice daily x 7 days Zofran every 6 hours as needed for nausea  Follow up with Dr Hilarie Fredrickson on Monday, 03/21/17 @ 3:45 pm.  Please contact our office after you see Dr Drue Flirt in May so we can discuss reinitiating of Humira. Vaughan Basta)  If you are age 61 or older, your body mass index should be between 23-30. Your Body mass index is 29.39 kg/m. If this is out of the aforementioned range listed, please consider follow up with your Primary Care Provider.  If you are age 68 or younger, your body mass index should be between 19-25. Your Body mass index is 29.39 kg/m. If this is out of the aformentioned range listed, please consider follow up with your Primary Care Provider.    **Of note, I have spoken to [place] @ [facility] 4:10 pm 01/18/17 to d/c order for bactrium and zofran sent in error to mail in. Order resent to local pharmacy instead. Dixon Boos, cma

## 2017-01-25 ENCOUNTER — Other Ambulatory Visit: Payer: Self-pay | Admitting: Obstetrics & Gynecology

## 2017-01-25 DIAGNOSIS — N6320 Unspecified lump in the left breast, unspecified quadrant: Secondary | ICD-10-CM

## 2017-02-10 ENCOUNTER — Ambulatory Visit
Admission: RE | Admit: 2017-02-10 | Discharge: 2017-02-10 | Disposition: A | Discharge status: Home / Self Care | Payer: BLUE CROSS/BLUE SHIELD | Source: Ambulatory Visit | Attending: Obstetrics & Gynecology | Admitting: Obstetrics & Gynecology

## 2017-02-10 DIAGNOSIS — N6320 Unspecified lump in the left breast, unspecified quadrant: Secondary | ICD-10-CM

## 2017-02-15 ENCOUNTER — Telehealth: Payer: Self-pay | Admitting: Internal Medicine

## 2017-02-15 NOTE — Telephone Encounter (Signed)
Pt states Dr. Drue Flirt saw her today and said she could start back on her Humira. Pt states she was told by Dr. Hilarie Fredrickson to call and let him know when she stated she could resume they Humira. Please advise.

## 2017-02-16 NOTE — Telephone Encounter (Signed)
Spoke with pt and she is aware and states she will resume the Humira on Friday.

## 2017-02-16 NOTE — Telephone Encounter (Signed)
Resume Humira 40 mg every 14 days at this time

## 2017-03-08 ENCOUNTER — Encounter: Payer: Self-pay | Admitting: *Deleted

## 2017-03-21 ENCOUNTER — Ambulatory Visit (INDEPENDENT_AMBULATORY_CARE_PROVIDER_SITE_OTHER): Payer: BLUE CROSS/BLUE SHIELD | Admitting: Internal Medicine

## 2017-03-21 ENCOUNTER — Ambulatory Visit: Payer: BLUE CROSS/BLUE SHIELD | Admitting: Internal Medicine

## 2017-03-21 ENCOUNTER — Encounter: Payer: Self-pay | Admitting: Internal Medicine

## 2017-03-21 VITALS — BP 130/66 | HR 76 | Ht 62.6 in | Wt 172.2 lb

## 2017-03-21 DIAGNOSIS — R600 Localized edema: Secondary | ICD-10-CM

## 2017-03-21 DIAGNOSIS — K5000 Crohn's disease of small intestine without complications: Secondary | ICD-10-CM

## 2017-03-21 DIAGNOSIS — Z79899 Other long term (current) drug therapy: Secondary | ICD-10-CM | POA: Diagnosis not present

## 2017-03-21 NOTE — Progress Notes (Signed)
Subjective:    Patient ID: Donna Richmond, female    DOB: 1956-08-11, 61 y.o.   MRN: [id number]  HPI Donna Richmond is a 61 year old female with a history of ileocolonic Crohn's with perianal involvement who is here for follow-up. She underwent laparoscopic ileocecectomy on 01/06/2017. She was cleared by Dr. Drue Flirt in May and resume Humira about 4 weeks ago.  She reports that she has been feeling great. Her appetite has improved dramatically. She has no nausea or abdominal pain. She's having 2-4 stools per day which are formed and soft. Occasionally stools will be slightly urgent or mildly loose and she is using Imodium 2 mg with good result. Dr. Drue Flirt prescribed her the Imodium. She is not using it daily. She plans to return to work in about a month and feels that she may need Imodium occasionally while working as she works on an [employer], television/film set. Again she's having no abdominal pain. No blood in her stool or melena. We treated her with sulfa for 1 week for some drainage at her abdominal incision. This healed completely with no further drainage tenderness or pain. She denies blood in her stool and melena.  She has noticed new bilateral lower extremity edema. Primary care gave her Lasix 20 mg but asked that she not take this daily. She has felt heaviness in her bilateral lower extremities but no pain. She also takes a combination antihypertensive which includes hydrochlorothiazide.  She was recently prescribed nabumetone for plantar fasciitis but has not taken it because she wanted to discuss it with me.  Humira last dose 9 days ago. Some stinging with injection but no injection site pain or skin reaction.  No perianal pain, skin lesions or drainage  Review of Systems As per history of present illness, otherwise negative  Current Medications, Allergies, Past Medical History, Past Surgical History, Family History and Social History were reviewed in [employer] record.    Objective:   Physical Exam BP 130/66 (BP Location: Left Arm, Patient Position: Sitting, Cuff Size: Normal)   Pulse 76   Ht 5' 2.6" (1.59 m) Comment: height measured without shoes  Wt 172 lb 4 oz (78.1 kg)   BMI 30.91 kg/m  Constitutional: Well-developed and well-nourished. No distress. HEENT: Normocephalic and atraumatic. Oropharynx is clear and moist. Conjunctivae are normal.  No scleral icterus. Neck: Neck supple. Trachea midline. Cardiovascular: Normal rate, regular rhythm and intact distal pulses. No M/R/G Pulmonary/chest: Effort normal and breath sounds normal. No wheezing, rales or rhonchi. Abdominal: Soft, nontender, nondistended. Bowel sounds active throughout. There are no masses palpable. No hepatosplenomegaly.Abdominal incision is well-healed Extremities: no clubbing, cyanosis, 1+ bilateral lower extremity edema to the knees Neurological: Alert and oriented to person place and time. Skin: Skin is warm and dry. Psychiatric: Normal mood and affect. Behavior is normal.      Assessment & Plan:  61 year old female with a history of ileocolonic Crohn's with perianal involvement who is here for follow-up.   1. Ileal Crohn's disease with perianal involvement/status post ileocecectomy -- she is doing very well from a Crohn's perspective. She has achieved clinical remission and recovered well after surgery. We will continue Humira 40 mg every 14 days. Can use Imodium 2 mg 2-3 times a day if needed for loose stools though this sounds like a fairly rare issue. If this worsens she is asked to notify me. She voices understanding  2. Plantar fasciitis -- I recommended against nabumetone as this is an NSAID in the setting of  her Crohn's disease. She reports Tylenol helps just as much and she can use this up to 4 g in 24 hours  3. Bilateral lower extremity edema -- significant and new since seeing me last. I asked that she contact primary care for ongoing management of this issue. She will  also discuss compression stockings with primary care  25 minutes spent with the patient today. Greater than 50% was spent in counseling and coordination of care with the patient

## 2017-03-21 NOTE — Patient Instructions (Addendum)
Follow up with Dr Hilarie Fredrickson in 9 months.  Continue Humira.  Contact your primary care physician about your lower extremity swelling and lasix.  We would advise against the use of nabumetone.  If you are age 61 or older, your body mass index should be between 23-30. Your Body mass index is 30.91 kg/m. If this is out of the aforementioned range listed, please consider follow up with your Primary Care Provider.  If you are age 45 or younger, your body mass index should be between 19-25. Your Body mass index is 30.91 kg/m. If this is out of the aformentioned range listed, please consider follow up with your Primary Care Provider.

## 2017-06-07 ENCOUNTER — Telehealth: Payer: Self-pay | Admitting: Internal Medicine

## 2017-06-08 NOTE — Telephone Encounter (Signed)
Left message for pt to call back  °

## 2017-06-09 NOTE — Telephone Encounter (Signed)
Left message for pt to call back  °

## 2017-06-10 ENCOUNTER — Other Ambulatory Visit: Payer: Self-pay | Admitting: Obstetrics & Gynecology

## 2017-06-10 DIAGNOSIS — N6002 Solitary cyst of left breast: Secondary | ICD-10-CM

## 2017-06-10 NOTE — Telephone Encounter (Signed)
Pt states she thinks she has a hernia where she had her surgery. She has talked to Dr. Geronimo Boot office and will see her. She wanted to let Dr. Hilarie Fredrickson know. Dr. Hilarie Fredrickson notified.

## 2017-07-14 ENCOUNTER — Telehealth: Payer: Self-pay | Admitting: Internal Medicine

## 2017-07-14 NOTE — Telephone Encounter (Signed)
Discussed with pt that it should not be a problem for her to have the flu shot prior to her surgery on 08/15/17 with Dr. Drue Flirt but she should check with her to make sure. Pt also wanted to let Dr. Hilarie Fredrickson know that Dr. Drue Flirt instructed her to hold the Humira before the surgery and she would instruct her as to when to resume the Humira. Pt wanted to know if that would be ok with Dr. Hilarie Fredrickson. Let pt know that holding it prior to surgery was normal and Dr. Hilarie Fredrickson would be fine with that. Dr. Hilarie Fredrickson notified.

## 2017-08-09 DIAGNOSIS — K432 Incisional hernia without obstruction or gangrene: Secondary | ICD-10-CM | POA: Diagnosis not present

## 2017-08-09 DIAGNOSIS — K50012 Crohn's disease of small intestine with intestinal obstruction: Secondary | ICD-10-CM | POA: Diagnosis not present

## 2017-08-10 ENCOUNTER — Other Ambulatory Visit: Payer: Self-pay | Admitting: Obstetrics & Gynecology

## 2017-08-10 ENCOUNTER — Ambulatory Visit
Admission: RE | Admit: 2017-08-10 | Discharge: 2017-08-10 | Disposition: A | Discharge status: Home / Self Care | Payer: BLUE CROSS/BLUE SHIELD | Source: Ambulatory Visit | Attending: Obstetrics & Gynecology | Admitting: Obstetrics & Gynecology

## 2017-08-10 DIAGNOSIS — Z6831 Body mass index (BMI) 31.0-31.9, adult: Secondary | ICD-10-CM | POA: Diagnosis not present

## 2017-08-10 DIAGNOSIS — Z01419 Encounter for gynecological examination (general) (routine) without abnormal findings: Secondary | ICD-10-CM | POA: Diagnosis not present

## 2017-08-10 DIAGNOSIS — N6489 Other specified disorders of breast: Secondary | ICD-10-CM | POA: Diagnosis not present

## 2017-08-10 DIAGNOSIS — N6310 Unspecified lump in the right breast, unspecified quadrant: Secondary | ICD-10-CM

## 2017-08-10 DIAGNOSIS — N6002 Solitary cyst of left breast: Secondary | ICD-10-CM | POA: Diagnosis not present

## 2017-08-10 DIAGNOSIS — R928 Other abnormal and inconclusive findings on diagnostic imaging of breast: Secondary | ICD-10-CM | POA: Diagnosis not present

## 2017-08-12 DIAGNOSIS — N182 Chronic kidney disease, stage 2 (mild): Secondary | ICD-10-CM | POA: Diagnosis not present

## 2017-08-12 DIAGNOSIS — E782 Mixed hyperlipidemia: Secondary | ICD-10-CM | POA: Diagnosis not present

## 2017-08-12 DIAGNOSIS — E663 Overweight: Secondary | ICD-10-CM | POA: Diagnosis not present

## 2017-08-12 DIAGNOSIS — I129 Hypertensive chronic kidney disease with stage 1 through stage 4 chronic kidney disease, or unspecified chronic kidney disease: Secondary | ICD-10-CM | POA: Diagnosis not present

## 2017-08-15 DIAGNOSIS — L905 Scar conditions and fibrosis of skin: Secondary | ICD-10-CM | POA: Diagnosis not present

## 2017-08-15 DIAGNOSIS — K5000 Crohn's disease of small intestine without complications: Secondary | ICD-10-CM | POA: Diagnosis not present

## 2017-08-15 DIAGNOSIS — K5090 Crohn's disease, unspecified, without complications: Secondary | ICD-10-CM | POA: Diagnosis not present

## 2017-08-15 DIAGNOSIS — I10 Essential (primary) hypertension: Secondary | ICD-10-CM | POA: Diagnosis not present

## 2017-08-15 DIAGNOSIS — G8918 Other acute postprocedural pain: Secondary | ICD-10-CM | POA: Diagnosis not present

## 2017-08-15 DIAGNOSIS — Z9889 Other specified postprocedural states: Secondary | ICD-10-CM | POA: Diagnosis not present

## 2017-08-15 DIAGNOSIS — Z881 Allergy status to other antibiotic agents status: Secondary | ICD-10-CM | POA: Diagnosis not present

## 2017-08-15 DIAGNOSIS — K432 Incisional hernia without obstruction or gangrene: Secondary | ICD-10-CM | POA: Diagnosis not present

## 2017-10-07 ENCOUNTER — Other Ambulatory Visit: Payer: Self-pay | Admitting: Internal Medicine

## 2017-12-09 DIAGNOSIS — Z6829 Body mass index (BMI) 29.0-29.9, adult: Secondary | ICD-10-CM | POA: Diagnosis not present

## 2017-12-09 DIAGNOSIS — J029 Acute pharyngitis, unspecified: Secondary | ICD-10-CM | POA: Diagnosis not present

## 2017-12-09 DIAGNOSIS — J101 Influenza due to other identified influenza virus with other respiratory manifestations: Secondary | ICD-10-CM | POA: Diagnosis not present

## 2017-12-16 ENCOUNTER — Telehealth: Payer: Self-pay | Admitting: Internal Medicine

## 2017-12-16 NOTE — Telephone Encounter (Signed)
Discussed with pt that as long as she is not running a fever and she is over the flu symptoms she should be able to take the Humira this weekend. Pt also asked about something for her nerves, states he daughter was just diagnosed with breast cancer and pt is quite tearful. Discussed with pt that she should contact her PCP regarding this issue.

## 2017-12-20 ENCOUNTER — Encounter: Payer: Self-pay | Admitting: *Deleted

## 2018-01-02 ENCOUNTER — Encounter: Payer: Self-pay | Admitting: Internal Medicine

## 2018-01-02 ENCOUNTER — Other Ambulatory Visit: Payer: Self-pay

## 2018-01-02 ENCOUNTER — Ambulatory Visit (INDEPENDENT_AMBULATORY_CARE_PROVIDER_SITE_OTHER): Payer: BLUE CROSS/BLUE SHIELD | Admitting: Internal Medicine

## 2018-01-02 ENCOUNTER — Ambulatory Visit: Payer: BLUE CROSS/BLUE SHIELD | Admitting: Internal Medicine

## 2018-01-02 VITALS — BP 118/80 | HR 84 | Ht 62.0 in | Wt 174.5 lb

## 2018-01-02 DIAGNOSIS — K50119 Crohn's disease of large intestine with unspecified complications: Secondary | ICD-10-CM

## 2018-01-02 DIAGNOSIS — K50019 Crohn's disease of small intestine with unspecified complications: Secondary | ICD-10-CM | POA: Diagnosis not present

## 2018-01-02 DIAGNOSIS — K432 Incisional hernia without obstruction or gangrene: Secondary | ICD-10-CM

## 2018-01-02 DIAGNOSIS — F419 Anxiety disorder, unspecified: Secondary | ICD-10-CM

## 2018-01-02 DIAGNOSIS — K6289 Other specified diseases of anus and rectum: Secondary | ICD-10-CM | POA: Diagnosis not present

## 2018-01-02 MED ORDER — ADALIMUMAB 40 MG/0.4ML ~~LOC~~ AJKT: 40.0000 mg | AUTO-INJECTOR | SUBCUTANEOUS | 11 refills | Status: DC

## 2018-01-02 MED ORDER — TINIDAZOLE 250 MG PO TABS: 250.0000 mg | ORAL_TABLET | Freq: Two times a day (BID) | ORAL | 0 refills | Status: DC

## 2018-01-02 MED ORDER — BUSPIRONE HCL 7.5 MG PO TABS: 7.5000 mg | ORAL_TABLET | Freq: Every day | ORAL | 0 refills | Status: DC

## 2018-01-02 NOTE — Progress Notes (Signed)
Subjective:    Patient ID: Donna Richmond, female    DOB: 1956-07-24, 62 y.o.   MRN: [id number]  HPI Donna Richmond is a 62 year old female with a history of ileocolonic Crohn's disease with perianal involvement status post laparoscopic ileocecectomy on 01/06/2017 who is here for follow-up.  She was last seen on 03/21/2017 and she presents alone today.  She has been maintained on Humira 40 mg every 14 days.  She reports overall she has been doing well but she has been under increased stress lately as her daughter, Donna Richmond, was diagnosed with breast cancer.  Her daughter had bilateral mastectomy but was also found to have lymph node involvement and will probably need chemotherapy to start soon.  This is caused worry and also sadness for her.  She has been back to work working 10-hour shifts and getting up about 3 AM.  She does get tired when working.  She had laparoscopic repair of incisional hernia performed by Dr. Drue Flirt in November 2018.  There was no mesh placement and she has recovered well from this surgery.  She feels that there may be a small bulge at the proximal edge of her incision left lateral.  This is not painful or tender as her incisional hernia was before.  She has had no drainage from her surgical incision.  She continues on the Humira 40 mg every 14 days.  She reports being able to tell when it is time for her medication.  The medication seems to give her more energy and makes her just generally feel better.  There is no specific GI symptom that worsens prior to her injection however.  She is still having loose stools which can happen 4-5 times per day without blood or melena.  She is using loperamide 2 mg every day when she is working occasionally additional doses.  This definitely helps.  She has had a good appetite.  No upper GI complaint.  She did have influenza several months ago but has recovered well.  She has noticed some firmness and perianal discomfort particularly with bowel  movement and just thereafter.  This is not a constant pain for her but she feels burning and stinging particular with bowel movement.  She is using Vaseline which seems to help some.  She also reports that she has had issues with anxiety and her family feels that she needs medication for this.  She wishes to avoid medication if possible but does admit to feelings of anxiousness which are precipitated by her daughter's cancer diagnosis.   Review of Systems As per HPI, otherwise negative  Current Medications, Allergies, Past Medical History, Past Surgical History, Family History and Social History were reviewed in [employer] record.     Objective:   Physical Exam BP 118/80   Pulse 84   Ht 5' 2"  (1.575 m)   Wt 174 lb 8 oz (79.2 kg)   BMI 31.92 kg/m  Constitutional: Well-developed and well-nourished. No distress. HEENT: Normocephalic and atraumatic. No scleral icterus. Neck: Neck supple. Trachea midline. Cardiovascular: Normal rate, regular rhythm and intact distal pulses. No M/R/G Pulmonary/chest: Effort normal and breath sounds normal. No wheezing, rales or rhonchi. Abdominal: Soft, nontender, nondistended. Bowel sounds active throughout. There are no masses palpable. No hepatosplenomegaly.   Transverse incision well healed around the umbilicus, there is a small 1-2 cm defect left lateral at cephalad portion of scar which is felt to be a small incisional hernia.  This is not tender. Rectal: Perianal  skin thickening with firmness and mild anal canal stenosis; several excoriations in the anal canal digital rectal exam performed with fifth digit with some tenderness without fluctuance or masses Extremities: no clubbing, cyanosis, or edema Neurological: Alert and oriented to person place and time. Skin: Skin is warm and dry. Psychiatric: Normal mood and affect. Behavior is normal.     Assessment & Plan:  62 year old female with a history of ileocolonic Crohn's  disease with perianal involvement status post laparoscopic ileocecectomy on 01/06/2017 who is here for follow-up.  1. Ileal Crohn's disease with perianal Crohn's/s/p ileocecectomy --overall she seems to be doing well but it appears she has some minor/mild activity in the perianal skin and anal canal.  He has recovered from ileocecectomy and incisional hernia repair. --I would like her to return for lab work including Humira level and antibody on 01/13/2018 (this will be a trough level).  She seems to feel better after her injection and I want to make sure if she has adequate drug levels at trough and also has not developed antibodies. --We will also check CBC, CMP and CRP along with QuantiFERON gold on that day --Tinidazole 250 mg twice daily times 10 days given perianal disease; no evidence for abscess but I would like to prevent possible abscess and also ensure no minor infection in the perianal skin --RectiCare per box instruction can be used up to 6 times per day for perianal and anal canal pain --Use creamy Desitin at bedtime and between bowel movements as needed; we discussed how zinc in this medication works as a natural antifungal --Continue Humira 40 mg every 14 days, switch to citrate free version --Continue Imodium 2 mg up to 4 times daily as needed  2.  Small incisional hernia --asymptomatic after repair of large incisional hernia in November.  Monitor for now.  I do not think she needs to follow-up with Dr. Drue Flirt at this time  3.  Anxiety --this is likely been an issue for longer than her daughter's recent diagnosis with breast cancer however it is exacerbated by this event.  I would like to avoid benzodiazepines but offered buspirone 7.5 mg twice daily.  This medication can be titrated as needed.  We discussed how this helps with anxiety but can also help with abdominal symptoms including bloating and discomfort.  She will think about this, discussed with her family and notify me if she  decides to try it.  Follow-up in 3-4 months, sooner if needed 40 minutes spent with the patient today. Greater than 50% was spent in counseling and coordination of care with the patient

## 2018-01-02 NOTE — Patient Instructions (Addendum)
Follow up with Dr Hilarie Fredrickson in 3-4 months.  Continue Humira (we will switch you to the citrate free version).  Continue imodium 2 mg four times daily as needed.  On 01/13/18, come to the basement floor of our building for labs: Humira level and antibody, CBC, CMP, CRP, Quant Gold.  We have sent the following medications to your pharmacy for you to pick up at your convenience: Tinidazole 250 mg twice daily x 10 days Buspar 7.5 mg once every night (call our office back in 2 weeks with an update on how you are feeling with this medication)  Please purchase the following medications over the counter and take as directed: Recticare-Apply to rectum as needed Creamy Desitin-apply to rectum at bedtime and as needed (in place of Vaseline)  If you are age 62 or older, your body mass index should be between 23-30. Your Body mass index is 31.92 kg/m. If this is out of the aforementioned range listed, please consider follow up with your Primary Care Provider.  If you are age 62 or younger, your body mass index should be between 19-25. Your Body mass index is 31.92 kg/m. If this is out of the aformentioned range listed, please consider follow up with your Primary Care Provider.

## 2018-01-13 ENCOUNTER — Telehealth: Payer: Self-pay | Admitting: Internal Medicine

## 2018-01-13 ENCOUNTER — Other Ambulatory Visit (INDEPENDENT_AMBULATORY_CARE_PROVIDER_SITE_OTHER): Payer: BLUE CROSS/BLUE SHIELD

## 2018-01-13 DIAGNOSIS — K50019 Crohn's disease of small intestine with unspecified complications: Secondary | ICD-10-CM | POA: Diagnosis not present

## 2018-01-13 LAB — COMPREHENSIVE METABOLIC PANEL
ALBUMIN: 4 g/dL (ref 3.5–5.2)
ALT: 11 U/L (ref 0–35)
AST: 13 U/L (ref 0–37)
Alkaline Phosphatase: 69 U/L (ref 39–117)
BILIRUBIN TOTAL: 0.6 mg/dL (ref 0.2–1.2)
BUN: 16 mg/dL (ref 6–23)
CALCIUM: 9.3 mg/dL (ref 8.4–10.5)
CO2: 30 meq/L (ref 19–32)
CREATININE: 0.75 mg/dL (ref 0.40–1.20)
Chloride: 101 mEq/L (ref 96–112)
GFR: 83.23 mL/min (ref >60.00)
Glucose, Bld: 95 mg/dL (ref 70–99)
Potassium: 3.5 mEq/L (ref 3.5–5.1)
Sodium: 138 mEq/L (ref 135–145)
Total Protein: 6.8 g/dL (ref 6.0–8.3)

## 2018-01-13 LAB — CBC WITH DIFFERENTIAL/PLATELET
BASOS ABS: 0 10*3/uL (ref 0.0–0.1)
Basophils Relative: 1 % (ref 0.0–3.0)
EOS ABS: 0.1 10*3/uL (ref 0.0–0.7)
Eosinophils Relative: 2.1 % (ref 0.0–5.0)
HCT: 36.6 % (ref 36.0–46.0)
HEMOGLOBIN: 12.6 g/dL (ref 12.0–15.0)
Lymphocytes Relative: 29.1 % (ref 12.0–46.0)
Lymphs Abs: 1.2 10*3/uL (ref 0.7–4.0)
MCHC: 34.4 g/dL (ref 30.0–36.0)
MCV: 85.4 fl (ref 78.0–100.0)
MONOS PCT: 8.7 % (ref 3.0–12.0)
Monocytes Absolute: 0.4 10*3/uL (ref 0.1–1.0)
NEUTROS ABS: 2.5 10*3/uL (ref 1.4–7.7)
Neutrophils Relative %: 59.1 % (ref 43.0–77.0)
PLATELETS: 232 10*3/uL (ref 150.0–400.0)
RBC: 4.29 Mil/uL (ref 3.87–5.11)
RDW: 13.6 % (ref 11.5–15.5)
WBC: 4.2 10*3/uL (ref 4.0–10.5)

## 2018-01-13 NOTE — Telephone Encounter (Signed)
Patient walked into office. She has received a jury summons and is requesting that Dr. Hilarie Fredrickson write her a note excusing her from jury duty. Patient says that she has crohns. Original Jury Duty summons given to [employer].  Juror/File # [id number]

## 2018-01-14 LAB — QUANTIFERON-TB GOLD PLUS
MITOGEN-NIL: 8.83 [IU]/mL
NIL: 0.05 [IU]/mL
QuantiFERON-TB Gold Plus: NEGATIVE
TB1-NIL: <0 IU/mL
TB2-NIL: <0 IU/mL

## 2018-01-16 NOTE — Telephone Encounter (Signed)
Dr Hilarie Fredrickson, if a jury letter is appropriate, please see communications and look at letter. Make changes if needed and send back to me.

## 2018-01-16 NOTE — Telephone Encounter (Signed)
Patient called back asking if the jury letter can be mailed to her instead.

## 2018-01-16 NOTE — Telephone Encounter (Signed)
I sent it back to you Thanks JMP

## 2018-01-16 NOTE — Telephone Encounter (Signed)
Letter printed and placed on Dr Vena Rua desk for signature.

## 2018-01-16 NOTE — Telephone Encounter (Signed)
Left voicemail for patient advising that jury letter and original summons has been placed at front desk for pick up.

## 2018-01-16 NOTE — Telephone Encounter (Signed)
Spoke to patient to advise that I am glad to put her letter/summons in the mail, however there is a chance that it could be lost and this is her original summons. She thanked me for reminding her of that and states she will come pick it up.

## 2018-01-20 LAB — ADALIMUMAB+AB (SERIAL MONITOR)
Adalimumab Drug Level: <0.6 ug/mL
Anti-Adalimumab Antibody: 2678 ng/mL

## 2018-01-20 LAB — SERIAL MONITORING

## 2018-02-01 ENCOUNTER — Telehealth: Payer: Self-pay | Admitting: Internal Medicine

## 2018-02-01 NOTE — Telephone Encounter (Signed)
Error

## 2018-02-06 ENCOUNTER — Telehealth: Payer: Self-pay | Admitting: Internal Medicine

## 2018-02-06 NOTE — Telephone Encounter (Signed)
Spoke with [facility] and the PA for Cimzia was denied. The preferred product for her insurance is Stelara. Please advise.

## 2018-02-07 ENCOUNTER — Other Ambulatory Visit: Payer: Self-pay

## 2018-02-07 DIAGNOSIS — K5090 Crohn's disease, unspecified, without complications: Secondary | ICD-10-CM

## 2018-02-07 NOTE — Telephone Encounter (Signed)
Ok to begin [place] with standard induction instead of Cimzia.  [facility] pharmacy has tried the prior authorization and her insurance has denied the Cimzia

## 2018-02-07 NOTE — Telephone Encounter (Signed)
Left message for pt to call back  °

## 2018-02-08 NOTE — Telephone Encounter (Signed)
Spoke with pt and she is aware. Will contact her when PA complete for Stelara.

## 2018-02-14 ENCOUNTER — Telehealth: Payer: Self-pay | Admitting: Internal Medicine

## 2018-02-14 NOTE — Telephone Encounter (Signed)
Error

## 2018-02-15 ENCOUNTER — Telehealth: Payer: Self-pay | Admitting: Internal Medicine

## 2018-02-15 NOTE — Telephone Encounter (Signed)
Pt would like to have Stelara this Friday because she is off work. Will try for this but that date does not work she requests 02/24/18. Will call short stay tomorrow to schedule.

## 2018-02-16 ENCOUNTER — Other Ambulatory Visit: Payer: Self-pay

## 2018-02-16 DIAGNOSIS — K50918 Crohn's disease, unspecified, with other complication: Secondary | ICD-10-CM

## 2018-02-16 NOTE — Telephone Encounter (Signed)
Pt scheduled for Stelara Infusion at [facility] 02/24/18@10am . Pt to check in with admitting upon arrival. Co-pay assistance card mailed to pt. Orders in epic.

## 2018-02-23 ENCOUNTER — Other Ambulatory Visit (HOSPITAL_COMMUNITY): Payer: Self-pay | Admitting: *Deleted

## 2018-02-24 ENCOUNTER — Ambulatory Visit (HOSPITAL_COMMUNITY)
Admission: RE | Admit: 2018-02-24 | Discharge: 2018-02-24 | Disposition: A | Discharge status: Home / Self Care | Payer: BLUE CROSS/BLUE SHIELD | Source: Ambulatory Visit | Attending: Internal Medicine | Admitting: Internal Medicine

## 2018-02-24 DIAGNOSIS — K50918 Crohn's disease, unspecified, with other complication: Secondary | ICD-10-CM | POA: Insufficient documentation

## 2018-02-24 MED ORDER — USTEKINUMAB 130 MG/26ML IV SOLN
390.0000 mg | Freq: Once | INTRAVENOUS | Status: AC
  Administered 2018-02-24: 390 mg via INTRAVENOUS
  Filled 2018-02-24: qty 78

## 2018-03-10 ENCOUNTER — Encounter: Payer: Self-pay | Admitting: Internal Medicine

## 2018-03-27 ENCOUNTER — Telehealth: Payer: Self-pay | Admitting: Internal Medicine

## 2018-03-27 NOTE — Telephone Encounter (Signed)
Follow up appt has been made for 06/09/18 per pt request.  She will call back with any further questions or concerns regarding medications.

## 2018-04-03 DIAGNOSIS — E782 Mixed hyperlipidemia: Secondary | ICD-10-CM | POA: Diagnosis not present

## 2018-04-03 DIAGNOSIS — I129 Hypertensive chronic kidney disease with stage 1 through stage 4 chronic kidney disease, or unspecified chronic kidney disease: Secondary | ICD-10-CM | POA: Diagnosis not present

## 2018-04-03 DIAGNOSIS — N182 Chronic kidney disease, stage 2 (mild): Secondary | ICD-10-CM | POA: Diagnosis not present

## 2018-04-03 DIAGNOSIS — K5090 Crohn's disease, unspecified, without complications: Secondary | ICD-10-CM | POA: Diagnosis not present

## 2018-04-17 ENCOUNTER — Telehealth: Payer: Self-pay

## 2018-04-17 NOTE — Telephone Encounter (Signed)
-----   Message from Algernon Huxley, RN sent at 03/08/2018 11:25 AM EDT ----- Regarding: Stelara Pt due for Stelara Injection 04/21/18

## 2018-04-17 NOTE — Telephone Encounter (Signed)
Left message for pt to call back. Just want to make sure pt is good for Stelara injection on 04/21/18.

## 2018-04-18 NOTE — Telephone Encounter (Signed)
Pt has the Stelara and will take the med on Friday.

## 2018-04-28 ENCOUNTER — Telehealth: Payer: Self-pay | Admitting: Internal Medicine

## 2018-04-28 NOTE — Telephone Encounter (Signed)
Pt needs some information about some vaccinations that she had with [place].

## 2018-04-28 NOTE — Telephone Encounter (Signed)
Pt states the company that makes her Stelara have called and are asking when she was last tested for TB. Let pt know she had a Quantiferon gold level drawn 01/13/18.

## 2018-05-12 ENCOUNTER — Telehealth: Payer: Self-pay | Admitting: Internal Medicine

## 2018-05-12 NOTE — Telephone Encounter (Signed)
I spoke with the patient.  She gave herself the Stelara injection on 04-21-18 in her right thigh, the next week she developed hip and leg pain.  She does report that she was on vacation at the time and was doing a lot of walking.  The pain was not resolved and she is wondering if it could be an adverse reaction to the Stelara.  This did not occur when she had the initial infusion.  I recommended she treat the symptoms :ice, rest, tylenol.  Please advise.  Thank you.

## 2018-05-14 NOTE — Telephone Encounter (Signed)
Agree with recommendation Please check on patient to see if leg is still bothering her. Perhaps the injection site caused a small hematoma.   If still hurting, then likely needs to be looked at I would plan for next injection as scheduled in 8 weeks and use Tylenol 1000 mg 1 hour before injection

## 2018-05-15 NOTE — Telephone Encounter (Signed)
Spoke with Donna Richmond and she states she is better, had a good day today. Donna Richmond aware of Dr. Vena Rua recommendations and she has an OV scheduled with Dr. Hilarie Fredrickson prior to next injection.

## 2018-05-15 NOTE — Telephone Encounter (Signed)
Left message for pt to call back  °

## 2018-06-09 ENCOUNTER — Encounter: Payer: Self-pay | Admitting: Internal Medicine

## 2018-06-09 ENCOUNTER — Ambulatory Visit (INDEPENDENT_AMBULATORY_CARE_PROVIDER_SITE_OTHER): Payer: BLUE CROSS/BLUE SHIELD | Admitting: Internal Medicine

## 2018-06-09 VITALS — BP 120/70 | HR 61 | Ht 62.0 in | Wt 180.0 lb

## 2018-06-09 DIAGNOSIS — K5010 Crohn's disease of large intestine without complications: Secondary | ICD-10-CM

## 2018-06-09 DIAGNOSIS — K5000 Crohn's disease of small intestine without complications: Secondary | ICD-10-CM | POA: Diagnosis not present

## 2018-06-09 DIAGNOSIS — R110 Nausea: Secondary | ICD-10-CM | POA: Diagnosis not present

## 2018-06-09 MED ORDER — ONDANSETRON HCL 4 MG PO TABS: 4.0000 mg | ORAL_TABLET | Freq: Four times a day (QID) | ORAL | 1 refills | Status: DC | PRN

## 2018-06-09 NOTE — Progress Notes (Signed)
Subjective:    Patient ID: Donna Richmond, female    DOB: 1956-07-09, 62 y.o.   MRN: [id number]  HPI Keenya Matera is a 62 year old female with a past medical history of ileocolonic Crohn's disease with perianal involvement status post laparoscopic ileocecectomy on 01/06/2017 who is here for follow-up.  She was last seen on 01/02/2018.  After her last visit we discovered that she had formed significant antibodies to Humira and this was discontinued in favor of Stelara.  She began Stelara on 02/24/2018 with the initial infusion.  She then took a subcutaneous dose 8 weeks later and is due her next dose next week.  She did take her first subcutaneous injection in her right thigh and a few days later she had right hip pain radiating down her right leg.  She was also on vacation and very active around that time and never knew if it was related to [facility] or not.  The pain resolved entirely.  On the whole she feels that she is doing well with Stelara.  Her perianal disease is definitively better.  She has had no further perianal pain, swelling or bleeding.  She is taking loperamide 2 mg each morning but primarily so that she does not have to have a bowel movement or loose stool at work.  She usually has one bowel movement each morning before work and then she will take loperamide.  On several occasions she has experienced abdominal pressure and bloating symptom, relieved by bowel movement.  She is having intermittent issues with nausea or a "sicky" feeling.  In the past Zofran has helped with this pain.  No abdominal pain today.  Good appetite.   Review of Systems As per HPI, otherwise negative  Current Medications, Allergies, Past Medical History, Past Surgical History, Family History and Social History were reviewed in [employer] record.     Objective:   Physical Exam BP 120/70   Pulse 61   Ht 5' 2"  (1.575 m)   Wt 180 lb (81.6 kg)   BMI 32.92 kg/m  Constitutional:  Well-developed and well-nourished. No distress. HEENT: Normocephalic and atraumatic. Conjunctivae are normal.  No scleral icterus. Neck: Neck supple. Trachea midline. Cardiovascular: Normal rate, regular rhythm and intact distal pulses. No M/R/G Pulmonary/chest: Effort normal and breath sounds normal. No wheezing, rales or rhonchi. Abdominal: Soft, nontender, nondistended. Bowel sounds active throughout. There are no masses palpable. No hepatosplenomegaly. Extremities: no clubbing, cyanosis, or edema Neurological: Alert and oriented to person place and time. Skin: Skin is warm and dry. Psychiatric: Normal mood and affect. Behavior is normal.       Assessment & Plan:  62 year old female with a past medical history of ileocolonic Crohn's disease with perianal involvement status post laparoscopic ileocecectomy on 01/06/2017 who is here for follow-up.  1.  Ileal Crohn's disease with perianal involvement/status post ileocecectomy --she developed antibodies to Humira and has started on Stelara.  She is due her second subcutaneous dose next week.  I feel that this is doing a nice job of controlling her disease and her perianal disease is under much better control.  Difficult to know if her right hip pain was related to Stelara but I think unlikely.  She will let me know if she develops similar pain after her next injection.  I advised she inject her left thigh next week for that specific dose.  2.  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  I think on the days that she is experiencing pressure and abdominal bloating it is likely secondary to her loperamide use.  It is likely the symptoms would not occur if she was off of antidiarrheal and had a regular bowel movement.  I advised that she use loperamide more only as needed for loose stools rather than scheduled.  She will try this and  let me know how it is working.  3.  Nausea --intermittent.  Without vomiting.  Zofran 4 mg every 6 hours as needed  25 minutes spent with the patient today. Greater than 50% was spent in counseling and coordination of care with the patient   5-monthfollow-up, sooner if needed

## 2018-06-09 NOTE — Patient Instructions (Addendum)
Continue Stelara.  Continue Imodium (loperamide) as needed for diarrhea.  We have sent the following medications to your pharmacy for you to pick up at your convenience: Zofran 4 mg every 6 hours as needed  Please follow up with Dr Hilarie Fredrickson in 6 months.  If you are age 62 or older, your body mass index should be between 23-30. Your Body mass index is 32.92 kg/m. If this is out of the aforementioned range listed, please consider follow up with your Primary Care Provider.  If you are age 53 or younger, your body mass index should be between 19-25. Your Body mass index is 32.92 kg/m. If this is out of the aformentioned range listed, please consider follow up with your Primary Care Provider.

## 2018-06-16 ENCOUNTER — Other Ambulatory Visit: Payer: Self-pay | Admitting: Obstetrics & Gynecology

## 2018-06-16 ENCOUNTER — Other Ambulatory Visit: Payer: Self-pay | Admitting: Obstetrics and Gynecology

## 2018-06-16 DIAGNOSIS — N6002 Solitary cyst of left breast: Secondary | ICD-10-CM

## 2018-07-14 ENCOUNTER — Other Ambulatory Visit: Payer: BLUE CROSS/BLUE SHIELD

## 2018-07-19 DIAGNOSIS — N390 Urinary tract infection, site not specified: Secondary | ICD-10-CM | POA: Diagnosis not present

## 2018-07-19 DIAGNOSIS — E669 Obesity, unspecified: Secondary | ICD-10-CM | POA: Diagnosis not present

## 2018-07-19 DIAGNOSIS — Z6831 Body mass index (BMI) 31.0-31.9, adult: Secondary | ICD-10-CM | POA: Diagnosis not present

## 2018-07-22 IMAGING — CT CT ABD-PELV W/ CM
2 of 5 series · 15 of 46 positions shown, 17 images · IV contrast (ISOVUE 300)
Comparison: 10/22/2016 CT.

CLINICAL DATA: 60-year-old female with Crohn's disease on
antibiotics and prednisone. Persistent left lower quadrant
tenderness. Three week follow-up of abscess. Subsequent encounter.

EXAM:
CT ABDOMEN AND PELVIS WITH CONTRAST
TECHNIQUE: Multidetector CT imaging of the abdomen and pelvis was performed
using the standard protocol following bolus administration of
intravenous contrast.
CONTRAST:  100mL OJSJW6-IOO IOPAMIDOL (OJSJW6-IOO) INJECTION 61%

[Series 2: abd/pel w · axial · 0.79mm/px · z∈[-451,-16]mm · 12 of 97 slices shown, 14 images]
[im 5/97  soft-tissue]
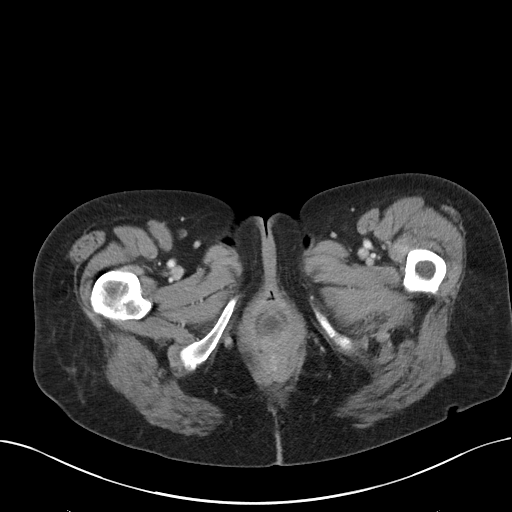
[im 5/97  bone]
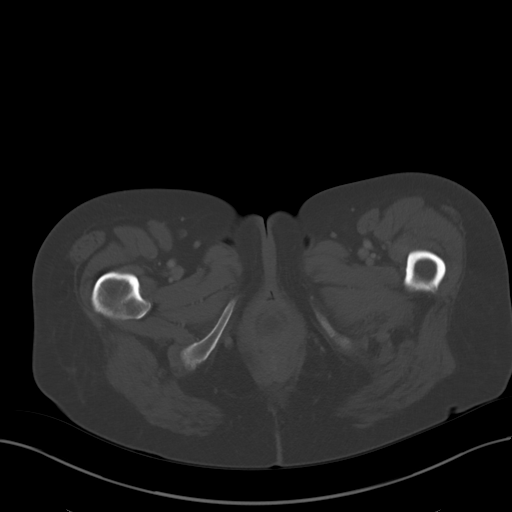
[im 15/97  soft-tissue]
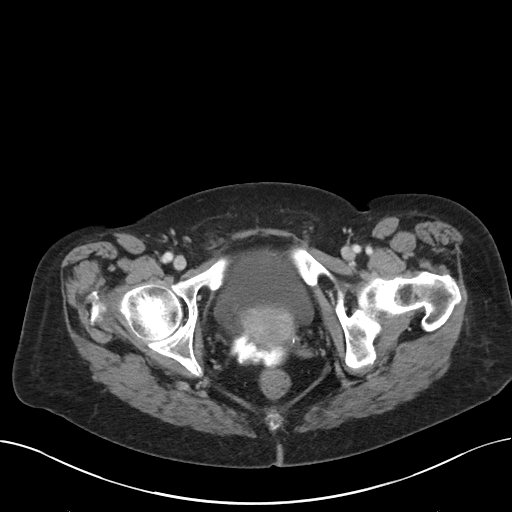
[im 20/97  soft-tissue]
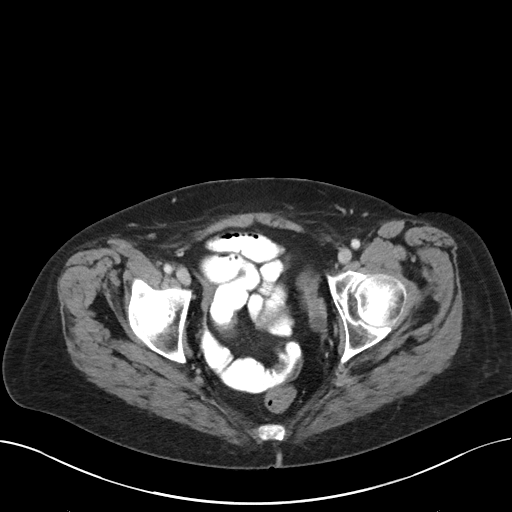
[im 29/97  soft-tissue]
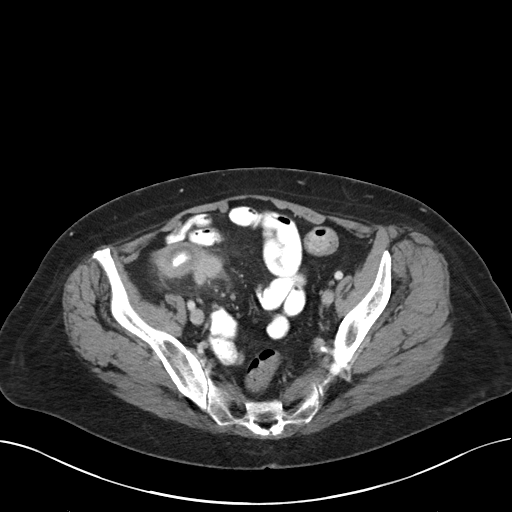
[im 39/97  soft-tissue]
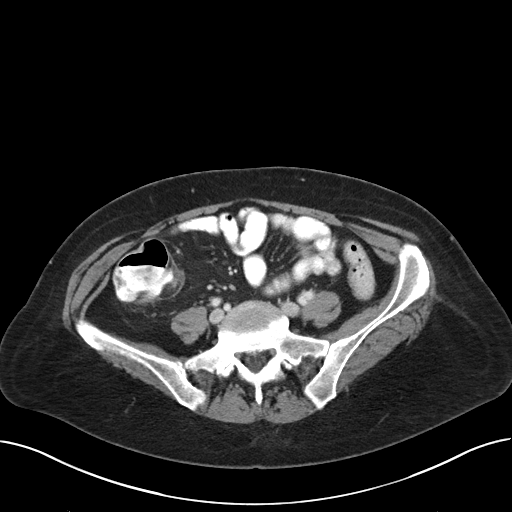
[im 44/97  soft-tissue]
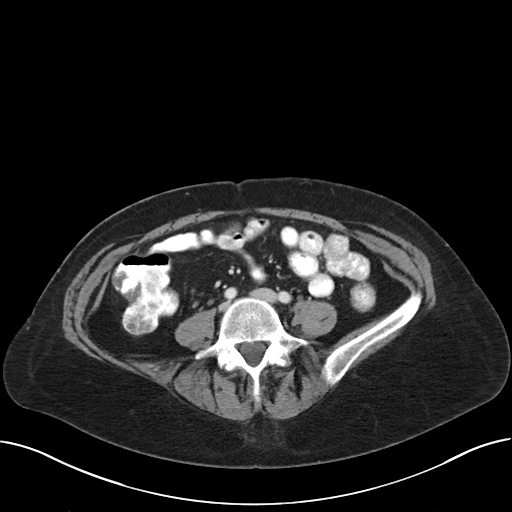
[im 53/97  soft-tissue]
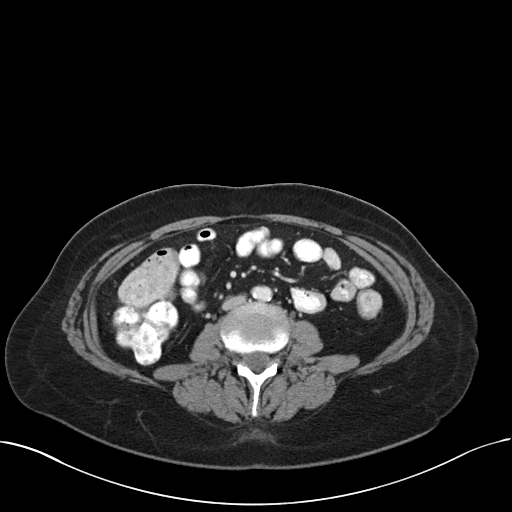
[im 58/97  soft-tissue]
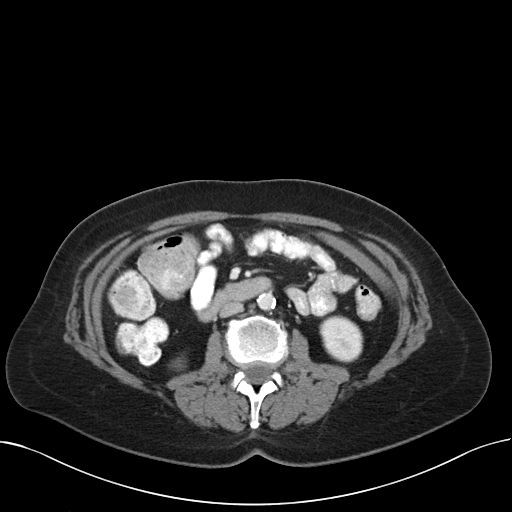
[im 68/97  soft-tissue]
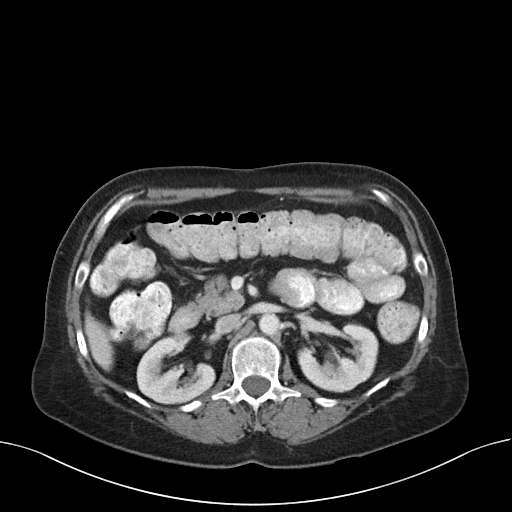
[im 68/97  bone]
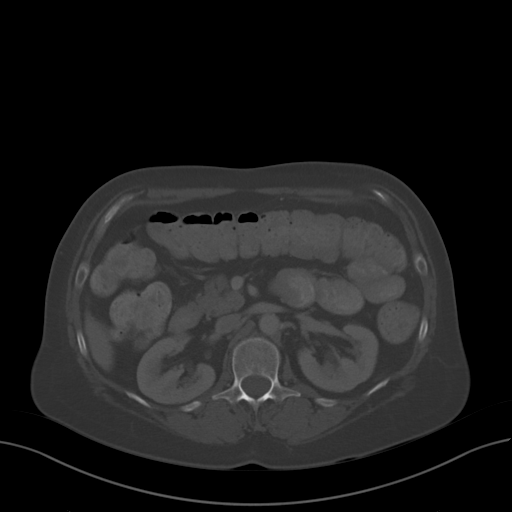
[im 77/97  soft-tissue]
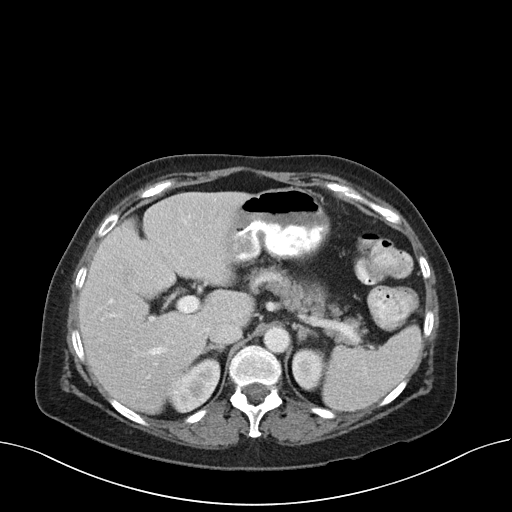
[im 82/97  soft-tissue]
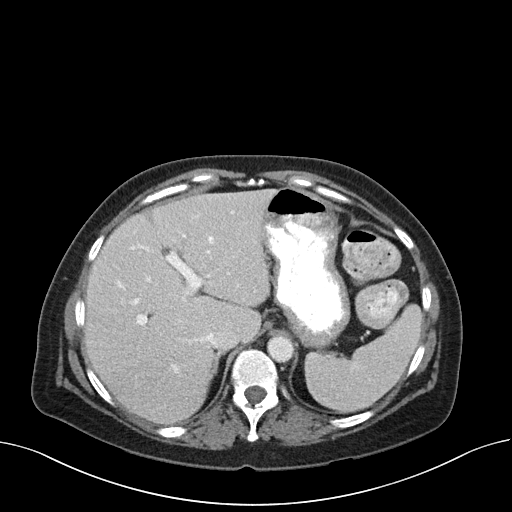
[im 92/97  soft-tissue]
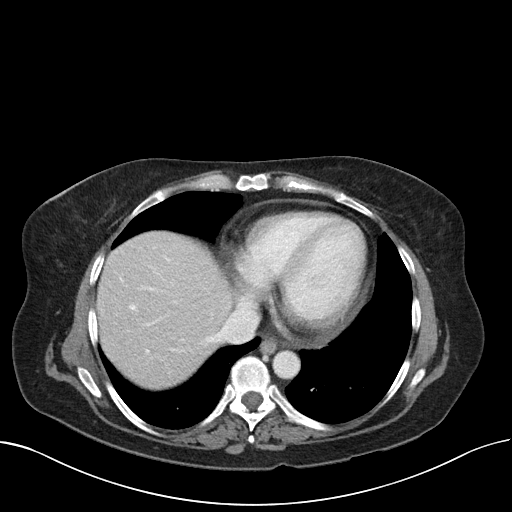

[Series 6: abd/pel w st · coronal · 0.68mm/px · 3 of 71 slices shown]
[im 24/71  soft-tissue]
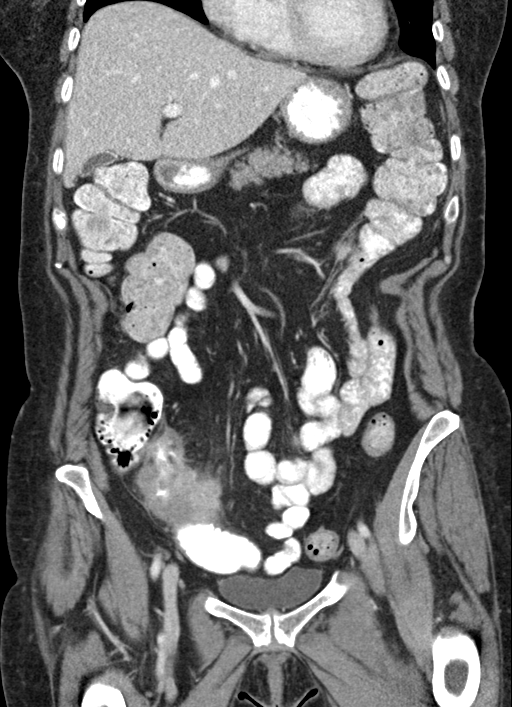
[im 32/71  soft-tissue]
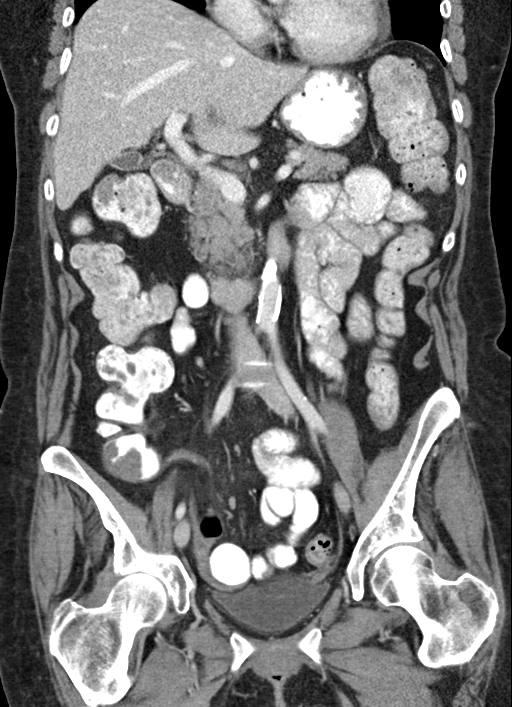
[im 39/71  soft-tissue]
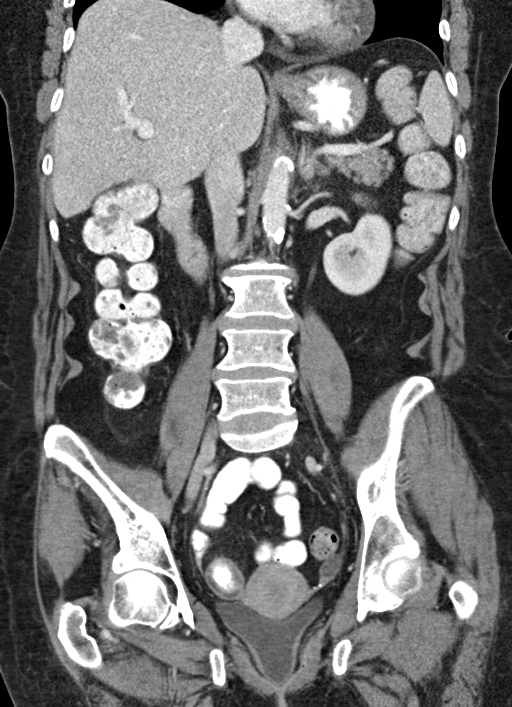

[15 of 46 positions shown; findings below may reference images not displayed]

FINDINGS: Lower chest: Lung bases clear. Heart size top-normal. Trace
pericardial fluid.

Hepatobiliary: No worrisome hepatic lesion. No calcified gallstones.

Pancreas: No mass or inflammation.

Spleen: No mass or enlargement.

Adrenals/Urinary Tract: No hydrocephalus. No worrisome renal or
adrenal lesion. There may be tiny renal cysts.

Stomach/Bowel: Diffuse inflammation of the terminal ileum consistent
with patient's history of Crohn's disease (lymphoma could have a
similar appearance by CT but does not appear to be case clinically).
Small abscess medial aspect measures up to 2.8 cm containing small
amount of fluid and gas and better defined than on the prior exam.
Intramural abscess lateral aspect measures up to 9 mm. Stranding of
adjacent fat planes.

No new area of inflammation separate from this region.

Small stone within the appendix without inflammation centered at
this level.

Vascular/Lymphatic: Small lymph nodes right lower quadrant adjacent
to the inflammation.

Atherosclerotic changes abdominal aorta without large vessel
occlusion.

Reproductive: Prolapsing uterus.  No adnexal mass.

Other: No free intraperitoneal air.

Musculoskeletal: Degenerative changes L2-3, L4-5 and L5-S1.
IMPRESSION: Diffuse inflammation of the terminal ileum consistent with patient's
history of Crohn's disease. Small abscess medial aspect measures up
to 2.8 cm containing small amount of fluid and gas and better
defined than on the prior exam. Intramural abscess lateral aspect
measures up to 9 mm. Stranding of adjacent fat planes.

No new area of inflammation separate from this region.

Small stone within the appendix without inflammation centered at
this level.

## 2018-08-14 ENCOUNTER — Other Ambulatory Visit: Payer: Self-pay | Admitting: Obstetrics and Gynecology

## 2018-08-14 ENCOUNTER — Ambulatory Visit
Admission: RE | Admit: 2018-08-14 | Discharge: 2018-08-14 | Disposition: A | Discharge status: Home / Self Care | Payer: BLUE CROSS/BLUE SHIELD | Source: Ambulatory Visit | Attending: Obstetrics and Gynecology | Admitting: Obstetrics and Gynecology

## 2018-08-14 DIAGNOSIS — Z1329 Encounter for screening for other suspected endocrine disorder: Secondary | ICD-10-CM | POA: Diagnosis not present

## 2018-08-14 DIAGNOSIS — R928 Other abnormal and inconclusive findings on diagnostic imaging of breast: Secondary | ICD-10-CM | POA: Diagnosis not present

## 2018-08-14 DIAGNOSIS — Z13220 Encounter for screening for lipoid disorders: Secondary | ICD-10-CM | POA: Diagnosis not present

## 2018-08-14 DIAGNOSIS — N6002 Solitary cyst of left breast: Secondary | ICD-10-CM

## 2018-08-14 DIAGNOSIS — Z6833 Body mass index (BMI) 33.0-33.9, adult: Secondary | ICD-10-CM | POA: Diagnosis not present

## 2018-08-14 DIAGNOSIS — N6321 Unspecified lump in the left breast, upper outer quadrant: Secondary | ICD-10-CM | POA: Diagnosis not present

## 2018-08-14 DIAGNOSIS — N644 Mastodynia: Secondary | ICD-10-CM

## 2018-08-14 DIAGNOSIS — Z01419 Encounter for gynecological examination (general) (routine) without abnormal findings: Secondary | ICD-10-CM | POA: Diagnosis not present

## 2018-08-14 DIAGNOSIS — N6489 Other specified disorders of breast: Secondary | ICD-10-CM | POA: Diagnosis not present

## 2018-08-14 DIAGNOSIS — Z124 Encounter for screening for malignant neoplasm of cervix: Secondary | ICD-10-CM | POA: Diagnosis not present

## 2018-08-14 DIAGNOSIS — Z130 Encounter for screening for diseases of the blood and blood-forming organs and certain disorders involving the immune mechanism: Secondary | ICD-10-CM | POA: Diagnosis not present

## 2018-09-19 DIAGNOSIS — J029 Acute pharyngitis, unspecified: Secondary | ICD-10-CM | POA: Diagnosis not present

## 2018-09-19 DIAGNOSIS — Z20828 Contact with and (suspected) exposure to other viral communicable diseases: Secondary | ICD-10-CM | POA: Diagnosis not present

## 2018-09-19 DIAGNOSIS — E669 Obesity, unspecified: Secondary | ICD-10-CM | POA: Diagnosis not present

## 2018-09-19 DIAGNOSIS — Z6831 Body mass index (BMI) 31.0-31.9, adult: Secondary | ICD-10-CM | POA: Diagnosis not present

## 2018-09-22 ENCOUNTER — Telehealth: Payer: Self-pay | Admitting: Internal Medicine

## 2018-09-22 NOTE — Telephone Encounter (Signed)
Pt wanted to know when she needs to follow-up with Dr. Hilarie Fredrickson. Discussed with pt that per OV note she is due for follow-up in March. Pt knows to call back the first part of Feb to schedule an OV. Pt also wanted to know if she needs to get the flu shot. Discussed with her that Dr. Hilarie Fredrickson does recommend that pts on biologics do get the flu shot. Pt verbalized understanding.

## 2018-09-22 NOTE — Telephone Encounter (Signed)
Pt will like a call to discuss some f/u instructions.

## 2018-10-23 DIAGNOSIS — Z1331 Encounter for screening for depression: Secondary | ICD-10-CM | POA: Diagnosis not present

## 2018-10-23 DIAGNOSIS — N182 Chronic kidney disease, stage 2 (mild): Secondary | ICD-10-CM | POA: Diagnosis not present

## 2018-10-23 DIAGNOSIS — I129 Hypertensive chronic kidney disease with stage 1 through stage 4 chronic kidney disease, or unspecified chronic kidney disease: Secondary | ICD-10-CM | POA: Diagnosis not present

## 2018-10-23 DIAGNOSIS — E782 Mixed hyperlipidemia: Secondary | ICD-10-CM | POA: Diagnosis not present

## 2018-10-23 DIAGNOSIS — Z139 Encounter for screening, unspecified: Secondary | ICD-10-CM | POA: Diagnosis not present

## 2018-10-23 DIAGNOSIS — K5090 Crohn's disease, unspecified, without complications: Secondary | ICD-10-CM | POA: Diagnosis not present

## 2018-11-24 DIAGNOSIS — H8110 Benign paroxysmal vertigo, unspecified ear: Secondary | ICD-10-CM | POA: Diagnosis not present

## 2018-11-24 DIAGNOSIS — N182 Chronic kidney disease, stage 2 (mild): Secondary | ICD-10-CM | POA: Diagnosis not present

## 2018-11-24 DIAGNOSIS — Z139 Encounter for screening, unspecified: Secondary | ICD-10-CM | POA: Diagnosis not present

## 2018-11-24 DIAGNOSIS — I129 Hypertensive chronic kidney disease with stage 1 through stage 4 chronic kidney disease, or unspecified chronic kidney disease: Secondary | ICD-10-CM | POA: Diagnosis not present

## 2018-12-15 ENCOUNTER — Encounter: Payer: Self-pay | Admitting: Internal Medicine

## 2018-12-15 ENCOUNTER — Ambulatory Visit (INDEPENDENT_AMBULATORY_CARE_PROVIDER_SITE_OTHER): Payer: BLUE CROSS/BLUE SHIELD | Admitting: Internal Medicine

## 2018-12-15 ENCOUNTER — Other Ambulatory Visit: Payer: Self-pay

## 2018-12-15 VITALS — BP 138/84 | HR 80 | Temp 99.6°F | Ht 63.0 in | Wt 189.4 lb

## 2018-12-15 DIAGNOSIS — K5010 Crohn's disease of large intestine without complications: Secondary | ICD-10-CM

## 2018-12-15 DIAGNOSIS — R110 Nausea: Secondary | ICD-10-CM

## 2018-12-15 DIAGNOSIS — R195 Other fecal abnormalities: Secondary | ICD-10-CM

## 2018-12-15 DIAGNOSIS — K5000 Crohn's disease of small intestine without complications: Secondary | ICD-10-CM | POA: Diagnosis not present

## 2018-12-15 MED ORDER — LOPERAMIDE HCL 2 MG PO CAPS: 2.0000 mg | ORAL_CAPSULE | Freq: Three times a day (TID) | ORAL | 2 refills | Status: DC | PRN

## 2018-12-15 NOTE — Patient Instructions (Signed)
If you are age 63 or older, your body mass index should be between 23-30. Your Body mass index is 33.55 kg/m. If this is out of the aforementioned range listed, please consider follow up with your Primary Care Provider.  If you are age 36 or younger, your body mass index should be between 19-25. Your Body mass index is 33.55 kg/m. If this is out of the aformentioned range listed, please consider follow up with your Primary Care Provider.   Please see your local Podiatrist (foot doctor) for your bilateral toenail pain   Continue stelera.  Continue imodium 2 mg three times a day as needed.  Continue b12 daily.   Follow up in 6 months.  It was a pleasure to see you today!  Dr. Hilarie Fredrickson

## 2018-12-15 NOTE — Progress Notes (Signed)
   Subjective:    Patient ID: Donna Richmond, female    DOB: 01/02/1956, 63 y.o.   MRN: [id number]  HPI Donna Richmond is a 63 year old female with a history of ileal Crohn's disease and perianal Crohn's disease status post laparoscopic ileocecectomy in April 2018 who is here for follow-up.  She is here alone today and was last seen on 06/09/2018.  She has been previously treated with Humira but formed antibodies and has been managed with Stelara since May 2019.  She reports that she has been doing well.  She is using loperamide intermittently for loose stools but only 2 mg/day.  This works well.  She is not having issue with blood in her stool, frequent diarrhea, tenesmus or abdominal pain.  She does have abdominal soreness in her left upper abdomen near her incision when she bends over or lifting.  Occasional nausea worse if she does not eat often relieved by eating.  No issues with severe heartburn, dysphagia or odynophagia.  She has noticed bilateral great toe tingling and flaking nails.  Wonders if this is related to [employer].  She continues to work.   Review of Systems As per HPI, otherwise negative  Current Medications, Allergies, Past Medical History, Past Surgical History, Family History and Social History were reviewed in [employer] record.     Objective:   Physical Exam  BP 138/84   Pulse 80   Temp 99.6 F (37.6 C)   Ht 5' 3"  (1.6 m)   Wt 189 lb 6 oz (85.9 kg)   BMI 33.55 kg/m  Gen: awake, alert, NAD HEENT: anicteric, op clear CV: RRR, no mrg Pulm: CTA b/l Abd: soft, NT/ND, +BS throughout, incisional hernia at the left proximal edge of her well-healed incision Ext: no c/c, 1+ pretibial edema Neuro: nonfocal      Assessment & Plan:  63 year old female with a history of ileal Crohn's disease and perianal Crohn's disease status post laparoscopic ileocecectomy in April 2018 who is here for follow-up.  1.  Ileal Crohn's disease with perianal  involvement status post ileocecectomy --she is doing well clinically on Stelara.  We will continue Stelara with standard 98 mg injection every 8 weeks.  Her perianal disease is also healed nicely.  2.  Loose stool --intermittent and likely somewhat related to irritable bowel.  She will continue loperamide 2 mg on an as-needed basis.  3.  Nausea --intermittent and mild.  Ondansetron 4 mg 2-3 times a day if needed.  4.  Bilateral great toe discomfort --question if this is related to a fungal nail infection.  Referral to podiatry recommended.  24-monthfollow-up, sooner if necessary 25 minutes spent with the patient today. Greater than 50% was spent in counseling and coordination of care with the patient

## 2018-12-26 ENCOUNTER — Encounter: Payer: Self-pay | Admitting: Internal Medicine

## 2018-12-26 ENCOUNTER — Telehealth: Payer: Self-pay

## 2018-12-26 NOTE — Telephone Encounter (Signed)
Pt calling asking if Dr. Hilarie Fredrickson can write her out of work due to her crohns and being immunocompromised. States her HR person told her if he would write her out she could get short term disability. Please advise.

## 2018-12-26 NOTE — Telephone Encounter (Signed)
Work note written, see letters

## 2018-12-27 ENCOUNTER — Telehealth: Payer: Self-pay

## 2018-12-27 NOTE — Telephone Encounter (Signed)
Pt called wanting to know what to do with Short term disability papers. Instructed pt to contact the [employer] building, phone number [phone].

## 2018-12-27 NOTE — Telephone Encounter (Signed)
Spoke with pts husband and he requests letter to be mailed to the pts home. Letter mailed.

## 2019-01-11 NOTE — Telephone Encounter (Signed)
Awaiting Dr Hilarie Fredrickson to complete. He has been off this week.

## 2019-01-11 NOTE — Telephone Encounter (Signed)
Patient states [employer] sent disability papers to Dr.Pyrtle to fill out. Papers are on Dr.Pyrtle's desk. Patient requesting paper work be filled out and sent in as soon as possible. FYI

## 2019-02-22 NOTE — Telephone Encounter (Signed)
error 

## 2019-02-28 ENCOUNTER — Telehealth: Payer: Self-pay | Admitting: Internal Medicine

## 2019-02-28 NOTE — Telephone Encounter (Signed)
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 Does she feel comfortable returning to work?  Ultimately she needs to feel safe and comfortable at work, otherwise this will lead to anxiety and stress which can worsen her Crohn's disease

## 2019-02-28 NOTE — Telephone Encounter (Signed)
Pt has been out of work due to covid-19/medical leave. Her leave is up on 5/29 and she wants so know if Dr. Hilarie Fredrickson feels she is ok to go back to work. Please advise.

## 2019-03-01 NOTE — Telephone Encounter (Signed)
Pt states they have put in distancing and masks. Pt admits she would be nervous and anxious which may cause her crohn's to flare. Pt would like to extend the leave for another month. Pt written letter putting her out of work through the end of June. Letter sent via mychart and mail.

## 2019-03-29 ENCOUNTER — Encounter: Payer: Self-pay | Admitting: Internal Medicine

## 2019-03-29 ENCOUNTER — Telehealth: Payer: Self-pay | Admitting: Internal Medicine

## 2019-03-29 NOTE — Telephone Encounter (Signed)
Pt would like to speak with you regarding a letter to return to work that she needs. She did not want to give me more details just stated that she would like to talk to you. Pls call her.

## 2019-03-29 NOTE — Telephone Encounter (Signed)
Pt called needed letter to return to work as of 04/04/19. Letter mailed to pt and sent via mychart.

## 2019-03-30 DIAGNOSIS — N182 Chronic kidney disease, stage 2 (mild): Secondary | ICD-10-CM | POA: Diagnosis not present

## 2019-03-30 DIAGNOSIS — K5090 Crohn's disease, unspecified, without complications: Secondary | ICD-10-CM | POA: Diagnosis not present

## 2019-03-30 DIAGNOSIS — Z131 Encounter for screening for diabetes mellitus: Secondary | ICD-10-CM | POA: Diagnosis not present

## 2019-03-30 DIAGNOSIS — I129 Hypertensive chronic kidney disease with stage 1 through stage 4 chronic kidney disease, or unspecified chronic kidney disease: Secondary | ICD-10-CM | POA: Diagnosis not present

## 2019-03-30 DIAGNOSIS — E782 Mixed hyperlipidemia: Secondary | ICD-10-CM | POA: Diagnosis not present

## 2019-04-02 ENCOUNTER — Telehealth: Payer: Self-pay | Admitting: Internal Medicine

## 2019-04-02 NOTE — Telephone Encounter (Signed)
Pt would like to speak with you regarding her letter to return to work, she stated that it may need some change.

## 2019-04-03 NOTE — Telephone Encounter (Signed)
Pt called and states that there is a confirmed case of covid-19 at her job. She is requesting Dr. Hilarie Fredrickson put her out of work an additional 3 months. Letter sent to pt.

## 2019-06-27 ENCOUNTER — Telehealth: Payer: Self-pay | Admitting: Internal Medicine

## 2019-06-27 NOTE — Telephone Encounter (Signed)
Attempted to return call, call is answered but no one is on line. Will try again.

## 2019-06-28 NOTE — Telephone Encounter (Signed)
Pt need note to return to work as of 07/02/19. Letter left up front for pt to pickup.

## 2019-07-06 ENCOUNTER — Other Ambulatory Visit: Payer: Self-pay | Admitting: Obstetrics and Gynecology

## 2019-07-06 DIAGNOSIS — Z1231 Encounter for screening mammogram for malignant neoplasm of breast: Secondary | ICD-10-CM

## 2019-08-15 ENCOUNTER — Ambulatory Visit: Payer: BLUE CROSS/BLUE SHIELD | Admitting: Internal Medicine

## 2019-08-16 ENCOUNTER — Ambulatory Visit: Payer: BLUE CROSS/BLUE SHIELD

## 2019-08-23 ENCOUNTER — Telehealth: Payer: Self-pay | Admitting: Internal Medicine

## 2019-08-23 NOTE — Telephone Encounter (Signed)
Pt had colon with Dr. Lyndel Safe in [place] in October of 2016, pt states that was the information she needed. Pt thanked me for the call.

## 2019-08-24 DIAGNOSIS — E782 Mixed hyperlipidemia: Secondary | ICD-10-CM | POA: Diagnosis not present

## 2019-08-24 DIAGNOSIS — Z23 Encounter for immunization: Secondary | ICD-10-CM | POA: Diagnosis not present

## 2019-08-24 DIAGNOSIS — I129 Hypertensive chronic kidney disease with stage 1 through stage 4 chronic kidney disease, or unspecified chronic kidney disease: Secondary | ICD-10-CM | POA: Diagnosis not present

## 2019-08-29 DIAGNOSIS — H02821 Cysts of right upper eyelid: Secondary | ICD-10-CM | POA: Diagnosis not present

## 2019-09-06 ENCOUNTER — Other Ambulatory Visit: Payer: BC Managed Care – PPO

## 2019-09-06 ENCOUNTER — Other Ambulatory Visit: Payer: Self-pay

## 2019-09-06 ENCOUNTER — Encounter: Payer: Self-pay | Admitting: Internal Medicine

## 2019-09-06 ENCOUNTER — Ambulatory Visit (INDEPENDENT_AMBULATORY_CARE_PROVIDER_SITE_OTHER): Payer: BC Managed Care – PPO | Admitting: Internal Medicine

## 2019-09-06 VITALS — BP 120/84 | HR 84 | Temp 98.4°F | Ht 62.5 in | Wt 188.0 lb

## 2019-09-06 DIAGNOSIS — D849 Immunodeficiency, unspecified: Secondary | ICD-10-CM | POA: Diagnosis not present

## 2019-09-06 DIAGNOSIS — K5010 Crohn's disease of large intestine without complications: Secondary | ICD-10-CM | POA: Diagnosis not present

## 2019-09-06 DIAGNOSIS — K50018 Crohn's disease of small intestine with other complication: Secondary | ICD-10-CM

## 2019-09-06 DIAGNOSIS — K50918 Crohn's disease, unspecified, with other complication: Secondary | ICD-10-CM

## 2019-09-06 DIAGNOSIS — R195 Other fecal abnormalities: Secondary | ICD-10-CM

## 2019-09-06 DIAGNOSIS — R110 Nausea: Secondary | ICD-10-CM

## 2019-09-06 DIAGNOSIS — Z23 Encounter for immunization: Secondary | ICD-10-CM | POA: Diagnosis not present

## 2019-09-06 DIAGNOSIS — K432 Incisional hernia without obstruction or gangrene: Secondary | ICD-10-CM

## 2019-09-06 MED ORDER — AMBULATORY NON FORMULARY MEDICATION: 0 refills | Status: DC

## 2019-09-06 MED ORDER — LOPERAMIDE HCL 2 MG PO CAPS: 2.0000 mg | ORAL_CAPSULE | Freq: Three times a day (TID) | ORAL | 2 refills | Status: DC | PRN

## 2019-09-06 MED ORDER — SHINGRIX 50 MCG/0.5ML IM SUSR: INTRAMUSCULAR | 1 refills | Status: DC

## 2019-09-06 MED ORDER — ONDANSETRON HCL 4 MG PO TABS: 4.0000 mg | ORAL_TABLET | Freq: Four times a day (QID) | ORAL | 1 refills | Status: AC | PRN

## 2019-09-06 NOTE — Patient Instructions (Addendum)
Remain on Stelara.  We have sent the following medications to your pharmacy for you to pick up at your convenience: Loperamide Zofran  Continue b12.  Continue Vitamin D.  We have given you a pneumonia (pneumovax) vaccine today. You may experience a small amount of swelling and redness at the injection site. This is normal. Should you experience these symptoms, please apply ice to the injection area for 10-15 minutes every 2-3 hours. However, should these symptoms or any other symptoms related to the injection concern you, please call our office at [phone].  Your provider has requested that you go to the basement level for lab work before leaving today. Press "B" on the elevator. The lab is located at the first door on the left as you exit the elevator.  You have been scheduled to see Dr Drue Flirt for re-evaluation of your incisional hernia on 11/20/19 at 1045 am.

## 2019-09-06 NOTE — Progress Notes (Signed)
Subjective:    Patient ID: Donna Richmond, female    DOB: 09-17-1956, 63 y.o.   MRN: [id number]  HPI Donna Richmond is a 63 year old female with a history of ileal Crohn's disease and perianal Crohn's disease status post laparoscopic ileocecectomy in April 2018 maintained on Stelara therapy who is here for follow-up.  She is here alone today and was last seen in the office on 12/15/2018.  Of note she was previously treated with Humira but developed antibodies.  Stelara started May 2019.  She reports that she has been feeling fairly well.  She has been largely out of work due to COVID-19.  She has a couple years before she would be able to retire.  She has considered disability given her Crohn's disease and possibly bridging to retirement.  She has been out of work this most recent time since November 6.  Her employer asked for volunteers.  She does have severance until April.  Stelara seems to be working well.  She is due her next dose this Friday, 09/07/2019.  She still will have some discomfort in her abdomen with urgent loose stools if she eats heavy foods like spaghetti or pizza.  Other than that her bowel movements have been mostly regular.  She has not had any perianal discomfort burning or bleeding.  No blood in her stool or melena.  Her incisional/abdominal hernia is becoming more problematic and symptomatic.  She feels it is gotten larger and hurts her when she is active, bending or lifting.  She is interested in considering having this repaired.  When it bothers her she has to lie down or she will feel sick and nauseous.  She has had 2-3 spells of this in the last few months.  Outside of this no significant nausea though she likes having Zofran on hand because it helps when needed.  She also uses loperamide on occasion for loose stools and diarrhea.  She continues her vitamin D and B12.  She had the flu vaccine but is interested in Pneumovax and shingles vaccine   Review of Systems As  per HPI, otherwise negative  Current Medications, Allergies, Past Medical History, Past Surgical History, Family History and Social History were reviewed in [employer] record.    Objective:   Physical Exam BP 120/84   Pulse 84   Temp 98.4 F (36.9 C)   Ht 5' 2.5" (1.588 m)   Wt 188 lb (85.3 kg)   BMI 33.84 kg/m  Gen: awake, alert, NAD HEENT: anicteric CV: RRR, no mrg Pulm: CTA b/l Abd: soft, ND, +BS throughout, incisional hernia moderate in size and mildly tender, hernia is midline extending to left mid abd wall Ext: no c/c/e Neuro: nonfocal     Assessment & Plan:  63 year old female with a history of ileal Crohn's disease and perianal Crohn's disease status post laparoscopic ileocecectomy in April 2018 maintained on Stelara therapy who is here for follow-up.   1.  Ileal Crohn's disease with perianal involvement status post ileocecectomy in 2018 (diagnosis greater than 10 years ago) --Delsa Grana has definitively helped with her Crohn's disease and she has done well since her ileocecectomy.  She is also had better control of her perianal disease. --We are continuing Stelara 90 mg subcu every 8 weeks --Colonoscopy is recommended next September or October which is 5 years from the last --Pneumovax 13 vaccine today, Pneumovax 23 vaccine next year --Prescription for Shingrix vaccination series which she can get from her local pharmacy --She  is up-to-date with influenza vaccination  2.  Intermittent loose stools --likely Crohn's related but also related to prior surgery and possibly a component of IBS.  Loperamide is effective --Continue loperamide 2 to 4 mg to 3 times a day as needed  3.  Nausea --intermittent and well controlled with ondansetron 4 mg every 6-8 hours on an as-needed basis  4.  Incisional abdominal hernia --symptomatic and medium in size.  I recommended we refer her back to see Dr. Drue Flirt for consideration of hernia repair.  She is open to this  referral --Surgical referral to Dr. Drue Flirt --Abdominal binder in the interim  Follow-up in about 9 months, sooner if needed, we can discuss colonoscopy at that time

## 2019-09-08 LAB — QUANTIFERON-TB GOLD PLUS
Mitogen-NIL: 7.99 IU/mL
NIL: 0.04 IU/mL
QuantiFERON-TB Gold Plus: NEGATIVE
TB1-NIL: <0 IU/mL
TB2-NIL: 0 IU/mL

## 2019-09-10 ENCOUNTER — Ambulatory Visit
Admission: RE | Admit: 2019-09-10 | Discharge: 2019-09-10 | Disposition: A | Discharge status: Home / Self Care | Payer: BC Managed Care – PPO | Source: Ambulatory Visit | Attending: Obstetrics and Gynecology | Admitting: Obstetrics and Gynecology

## 2019-09-10 ENCOUNTER — Other Ambulatory Visit: Payer: Self-pay

## 2019-09-10 DIAGNOSIS — Z1231 Encounter for screening mammogram for malignant neoplasm of breast: Secondary | ICD-10-CM | POA: Diagnosis not present

## 2019-09-10 DIAGNOSIS — Z01419 Encounter for gynecological examination (general) (routine) without abnormal findings: Secondary | ICD-10-CM | POA: Diagnosis not present

## 2019-09-10 DIAGNOSIS — Z6834 Body mass index (BMI) 34.0-34.9, adult: Secondary | ICD-10-CM | POA: Diagnosis not present

## 2019-09-14 DIAGNOSIS — N182 Chronic kidney disease, stage 2 (mild): Secondary | ICD-10-CM | POA: Diagnosis not present

## 2019-09-14 DIAGNOSIS — K5090 Crohn's disease, unspecified, without complications: Secondary | ICD-10-CM | POA: Diagnosis not present

## 2019-09-14 DIAGNOSIS — E782 Mixed hyperlipidemia: Secondary | ICD-10-CM | POA: Diagnosis not present

## 2019-09-14 DIAGNOSIS — I129 Hypertensive chronic kidney disease with stage 1 through stage 4 chronic kidney disease, or unspecified chronic kidney disease: Secondary | ICD-10-CM | POA: Diagnosis not present

## 2019-11-20 DIAGNOSIS — K50012 Crohn's disease of small intestine with intestinal obstruction: Secondary | ICD-10-CM | POA: Diagnosis not present

## 2019-11-20 DIAGNOSIS — K432 Incisional hernia without obstruction or gangrene: Secondary | ICD-10-CM | POA: Diagnosis not present

## 2019-12-10 ENCOUNTER — Telehealth: Payer: Self-pay | Admitting: Internal Medicine

## 2019-12-10 NOTE — Telephone Encounter (Signed)
Patient is calling with questions in reference to covid vaccine along with other injection

## 2019-12-10 NOTE — Telephone Encounter (Signed)
Pt due for 2nd covid vaccine and she is due for stelara injection 3/26, she wanted to know if she needed to hold the stelara or proceed as scheduled. Discussed with pt that she is to stay on the schedule.

## 2020-01-07 DIAGNOSIS — E782 Mixed hyperlipidemia: Secondary | ICD-10-CM | POA: Diagnosis not present

## 2020-01-07 DIAGNOSIS — E538 Deficiency of other specified B group vitamins: Secondary | ICD-10-CM | POA: Diagnosis not present

## 2020-01-14 DIAGNOSIS — I700 Atherosclerosis of aorta: Secondary | ICD-10-CM | POA: Diagnosis not present

## 2020-01-14 DIAGNOSIS — E782 Mixed hyperlipidemia: Secondary | ICD-10-CM | POA: Diagnosis not present

## 2020-01-14 DIAGNOSIS — Z139 Encounter for screening, unspecified: Secondary | ICD-10-CM | POA: Diagnosis not present

## 2020-01-14 DIAGNOSIS — K5090 Crohn's disease, unspecified, without complications: Secondary | ICD-10-CM | POA: Diagnosis not present

## 2020-01-14 DIAGNOSIS — E538 Deficiency of other specified B group vitamins: Secondary | ICD-10-CM | POA: Diagnosis not present

## 2020-01-28 ENCOUNTER — Other Ambulatory Visit: Payer: Self-pay | Admitting: Internal Medicine

## 2020-03-05 ENCOUNTER — Telehealth: Payer: Self-pay | Admitting: Internal Medicine

## 2020-03-05 NOTE — Telephone Encounter (Signed)
Left message for pt to call back  °

## 2020-03-05 NOTE — Telephone Encounter (Signed)
Patient returned the call

## 2020-03-06 NOTE — Telephone Encounter (Signed)
Pt states she has been having some issues for about 3 weeks. States she has felt weak, has had some rectal burning and when she has a BM she states she has some blistering around her bottom. Pt also states she has been feeling sick on her stomach. Pt requesting to see Dr. Norman Herrlich. Scheduled pt for an appt to see Dr. Hilarie Fredrickson 03/11/20@4pm . Pt aware of appt.

## 2020-03-06 NOTE — Telephone Encounter (Signed)
Patient called to follow up on previous call

## 2020-03-11 ENCOUNTER — Encounter: Payer: Self-pay | Admitting: Internal Medicine

## 2020-03-11 ENCOUNTER — Ambulatory Visit (INDEPENDENT_AMBULATORY_CARE_PROVIDER_SITE_OTHER): Payer: BC Managed Care – PPO | Admitting: Internal Medicine

## 2020-03-11 ENCOUNTER — Other Ambulatory Visit (INDEPENDENT_AMBULATORY_CARE_PROVIDER_SITE_OTHER): Payer: BC Managed Care – PPO

## 2020-03-11 VITALS — BP 124/72 | HR 65 | Ht 63.0 in | Wt 193.6 lb

## 2020-03-11 DIAGNOSIS — K50018 Crohn's disease of small intestine with other complication: Secondary | ICD-10-CM

## 2020-03-11 DIAGNOSIS — K5010 Crohn's disease of large intestine without complications: Secondary | ICD-10-CM | POA: Diagnosis not present

## 2020-03-11 DIAGNOSIS — R195 Other fecal abnormalities: Secondary | ICD-10-CM

## 2020-03-11 LAB — CBC WITH DIFFERENTIAL/PLATELET
Basophils Absolute: 0 10*3/uL (ref 0.0–0.1)
Basophils Relative: 0.8 % (ref 0.0–3.0)
Eosinophils Absolute: 0.1 10*3/uL (ref 0.0–0.7)
Eosinophils Relative: 0.9 % (ref 0.0–5.0)
HCT: 39.2 % (ref 36.0–46.0)
Hemoglobin: 13.2 g/dL (ref 12.0–15.0)
Lymphocytes Relative: 26.4 % (ref 12.0–46.0)
Lymphs Abs: 1.5 10*3/uL (ref 0.7–4.0)
MCHC: 33.7 g/dL (ref 30.0–36.0)
MCV: 86.8 fl (ref 78.0–100.0)
Monocytes Absolute: 0.4 10*3/uL (ref 0.1–1.0)
Monocytes Relative: 7.7 % (ref 3.0–12.0)
Neutro Abs: 3.5 10*3/uL (ref 1.4–7.7)
Neutrophils Relative %: 64.2 % (ref 43.0–77.0)
Platelets: 215 10*3/uL (ref 150.0–400.0)
RBC: 4.51 Mil/uL (ref 3.87–5.11)
RDW: 13.3 % (ref 11.5–15.5)
WBC: 5.5 10*3/uL (ref 4.0–10.5)

## 2020-03-11 MED ORDER — CIPROFLOXACIN HCL 500 MG PO TABS: 500.0000 mg | ORAL_TABLET | Freq: Two times a day (BID) | ORAL | 0 refills | Status: DC

## 2020-03-11 MED ORDER — SUTAB 1479-225-188 MG PO TABS: 1.0000 | ORAL_TABLET | ORAL | 0 refills | Status: DC

## 2020-03-11 NOTE — Patient Instructions (Addendum)
We have sent the following medications to your pharmacy for you to pick up at your convenience: Cipro 500 mg twice daily x 7 days. _______________________________________  Your provider has requested that you go to the basement level for lab work before leaving today. Press "B" on the elevator. The lab is located at the first door on the left as you exit the elevator. ________________________________________________ Dennis Bast have been scheduled for a colonoscopy. Please follow written instructions given to you at your visit today.  Please pick up your prep supplies at the pharmacy within the next 1-3 days. If you use inhalers (even only as needed), please bring them with you on the day of your procedure. Your physician has requested that you go to www.startemmi.com and enter the access code given to you at your visit today. This web site gives a general overview about your procedure. However, you should still follow specific instructions given to you by our office regarding your preparation for the procedure. ________________________________________________ You have been scheduled for a CT enterography scan of the abdomen and pelvis at [facility] ([address]---this is in the same building as [employer]).   You are scheduled on __6/15/21__________ at _10:00am_____________. You should arrive at 8:30am 1 1/2   prior to your appointment time for registration and drink prep for CT enterography. Please follow the written instructions below on the day of your exam:  WARNING: IF YOU ARE ALLERGIC TO IODINE/X-RAY DYE, PLEASE NOTIFY RADIOLOGY IMMEDIATELY AT [phone]! YOU WILL BE GIVEN A 13 HOUR PREMEDICATION PREP.  1) Do not eat or drink anything after ________6:00am______ (4 hours prior to your test)  You may take any medications as prescribed with a small amount of water, if necessary. If you take any of the following medications: METFORMIN, GLUCOPHAGE, GLUCOVANCE, AVANDAMET,  RIOMET, FORTAMET, Coke MET, JANUMET, GLUMETZA or METAGLIP, you MAY be asked to HOLD this medication 48 hours AFTER the exam.  Depending on your individual set of symptoms, you may also receive an intravenous injection of x-ray contrast/dye. Plan on being at [facility] for 30 minutes or longer, depending on the type of exam you are having performed.  This test typically takes 30-45 minutes to complete.  If you have any questions regarding your exam or if you need to reschedule, you may call the CT department at [phone] between the hours of 8:00 am and 5:00 pm, Monday-Friday. ________________________________________________________________

## 2020-03-11 NOTE — Progress Notes (Signed)
c 

## 2020-03-11 NOTE — Progress Notes (Signed)
   Subjective:    Patient ID: Donna Richmond, female    DOB: 1956/03/02, 64 y.o.   MRN: [id number]  HPI Donna Richmond is a 64 year old female with a past medical history of ileal Crohn's and perianal Crohn's status post laparoscopic ileocecectomy in April 2018 maintained on Stelara who is here for follow-up.  She is here alone today and was last seen on 09/06/2019.  She was previously treated with Humira but developed antibodies and started Stelara in May 2019.  She reports that for the last 4 to 5 weeks she has developed abdominal pain, perianal irritation and burning as well as frequent diarrhea and nausea.  She is concerned that her Crohn's disease is flaring.  She reports with bowel movement she has perianal burning and irritation and it feels like there are "blisters" around her anus.  There is some pain in her lower abdomen before bowel movement.  She is having some nausea without vomiting.  Stools can be urgent and have been loose occurring 5-6 times per day.  Her last Stelara dose was 02/22/2020.  She saw Dr. Drue Richmond for consideration of repair of ventral hernia.  Dr. Drue Richmond referred her for general surgery evaluation for hernia repair but Donna Richmond decided to delay this for a bit.  Review of Systems As per HPI, otherwise negative  Current Medications, Allergies, Past Medical History, Past Surgical History, Family History and Social History were reviewed in [employer] record.     Objective:   Physical Exam BP 124/72   Pulse 65   Ht 5' 3"  (1.6 m)   Wt 193 lb 9.6 oz (87.8 kg)   SpO2 98%   BMI 34.29 kg/m  Gen: awake, alert, NAD HEENT: anicteric CV: RRR, no mrg Pulm: CTA b/l Abd: soft, mild to moderate lower abdominal tenderness without rebound or guarding, ventral hernia, +BS throughout Rectal: External exam reveals a mild excoriation left posterior anus, tissue edema at the anal canal as well as scant purulent discharge, internal exam not done today due  to tenderness Ext: no c/c/e Neuro: nonfocal      Assessment & Plan:  64 year old female with a past medical history of ileal Crohn's and perianal Crohn's status post laparoscopic ileocecectomy in April 2018 maintained on Stelara who is here for follow-up.  1.  Ileal and perianal Crohn's disease/prior ileocecectomy in 2018 (diagnosis greater than 10 years ago) --she had previously been doing well with Stelara since ileocecectomy but her symptoms of abdominal pain, perianal drainage, diarrhea are concerning for flare of her Crohn's disease.  I have recommended the following evaluation.  Her last Stelara dose was 02/22/2020 --CBC, CMP, CRP, Stelara level and antibody test --CT enterography with pelvis; evaluate for active Crohn's disease but also perianal fistula/abscess --Cipro 500 mg twice daily x7 days for perianal infection/active Crohn's --Colonoscopy in the [place] with propofol sedation.  We discussed the risk, benefits and alternatives and she is agreeable and wishes to proceed.  This will be performed after reviewing CT enterography findings --Bentyl 10 to 20 mg 3 times daily as needed crampy abdominal pain --She can continue loperamide 2 to 4 mg 3 times daily as needed --Zofran 4 mg every 6-8 hours as needed for nausea  30 minutes total spent today including patient facing time, coordination of care, reviewing medical history/procedures/pertinent radiology studies, and documentation of the encounter.

## 2020-03-12 ENCOUNTER — Telehealth: Payer: Self-pay

## 2020-03-12 LAB — COMPREHENSIVE METABOLIC PANEL
ALT: 18 U/L (ref 0–35)
AST: 19 U/L (ref 0–37)
Albumin: 4.5 g/dL (ref 3.5–5.2)
Alkaline Phosphatase: 66 U/L (ref 39–117)
BUN: 23 mg/dL (ref 6–23)
CO2: 27 mEq/L (ref 19–32)
Calcium: 9.7 mg/dL (ref 8.4–10.5)
Chloride: 102 mEq/L (ref 96–112)
Creatinine, Ser: 0.83 mg/dL (ref 0.40–1.20)
GFR: 69.18 mL/min (ref >60.00)
Glucose, Bld: 95 mg/dL (ref 70–99)
Potassium: 3.3 mEq/L — ABNORMAL LOW (ref 3.5–5.1)
Sodium: 137 mEq/L (ref 135–145)
Total Bilirubin: 0.5 mg/dL (ref 0.2–1.2)
Total Protein: 7.2 g/dL (ref 6.0–8.3)

## 2020-03-12 LAB — HIGH SENSITIVITY CRP: CRP, High Sensitivity: 2.13 mg/L (ref 0.000–5.000)

## 2020-03-12 MED ORDER — DICYCLOMINE HCL 10 MG PO CAPS: ORAL_CAPSULE | ORAL | 2 refills | Status: DC

## 2020-03-12 NOTE — Telephone Encounter (Addendum)
Done- pt informed.    ----- Message from Jerene Bears, MD sent at 03/11/2020  5:54 PM EDT ----- RoI meant to send Bentyl 10 to 20 mg 3 times daily as needed for crampy abdominal pain for Mrs. Nesheim.  Could you please sendThanksJMP

## 2020-03-17 ENCOUNTER — Ambulatory Visit: Payer: BC Managed Care – PPO | Admitting: Internal Medicine

## 2020-03-18 ENCOUNTER — Ambulatory Visit (INDEPENDENT_AMBULATORY_CARE_PROVIDER_SITE_OTHER)
Admission: RE | Admit: 2020-03-18 | Discharge: 2020-03-18 | Disposition: A | Discharge status: Home / Self Care | Payer: BC Managed Care – PPO | Source: Ambulatory Visit | Attending: Internal Medicine | Admitting: Internal Medicine

## 2020-03-18 ENCOUNTER — Other Ambulatory Visit: Payer: Self-pay

## 2020-03-18 DIAGNOSIS — K50018 Crohn's disease of small intestine with other complication: Secondary | ICD-10-CM | POA: Diagnosis not present

## 2020-03-18 DIAGNOSIS — R197 Diarrhea, unspecified: Secondary | ICD-10-CM | POA: Diagnosis not present

## 2020-03-18 DIAGNOSIS — R109 Unspecified abdominal pain: Secondary | ICD-10-CM | POA: Diagnosis not present

## 2020-03-18 MED ORDER — IOHEXOL 300 MG/ML  SOLN
100.0000 mL | Freq: Once | INTRAMUSCULAR | Status: AC | PRN
  Administered 2020-03-18: 100 mL via INTRAVENOUS

## 2020-04-02 ENCOUNTER — Encounter: Payer: Self-pay | Admitting: Internal Medicine

## 2020-04-08 ENCOUNTER — Other Ambulatory Visit: Payer: Self-pay

## 2020-04-08 ENCOUNTER — Encounter: Payer: Self-pay | Admitting: Internal Medicine

## 2020-04-08 ENCOUNTER — Ambulatory Visit (AMBULATORY_SURGERY_CENTER): Payer: BC Managed Care – PPO | Admitting: Internal Medicine

## 2020-04-08 VITALS — BP 136/61 | HR 72 | Temp 98.4°F | Resp 14 | Ht 63.0 in | Wt 193.0 lb

## 2020-04-08 DIAGNOSIS — Z8719 Personal history of other diseases of the digestive system: Secondary | ICD-10-CM

## 2020-04-08 DIAGNOSIS — K5080 Crohn's disease of both small and large intestine without complications: Secondary | ICD-10-CM | POA: Diagnosis not present

## 2020-04-08 DIAGNOSIS — K635 Polyp of colon: Secondary | ICD-10-CM | POA: Diagnosis not present

## 2020-04-08 DIAGNOSIS — K5090 Crohn's disease, unspecified, without complications: Secondary | ICD-10-CM | POA: Diagnosis not present

## 2020-04-08 DIAGNOSIS — D123 Benign neoplasm of transverse colon: Secondary | ICD-10-CM

## 2020-04-08 DIAGNOSIS — K5730 Diverticulosis of large intestine without perforation or abscess without bleeding: Secondary | ICD-10-CM | POA: Diagnosis not present

## 2020-04-08 DIAGNOSIS — D120 Benign neoplasm of cecum: Secondary | ICD-10-CM | POA: Diagnosis not present

## 2020-04-08 MED ORDER — SODIUM CHLORIDE 0.9 % IV SOLN: 500.0000 mL | Freq: Once | INTRAVENOUS | Status: DC

## 2020-04-08 NOTE — Progress Notes (Signed)
Pt's states no medical or surgical changes since previsit or office visit.  Vs-cw  Check in -mz

## 2020-04-08 NOTE — Progress Notes (Signed)
pt tolerated well. VSS. awake and to recovery. Report given to RN.  

## 2020-04-08 NOTE — Patient Instructions (Signed)
Handouts given:  Polyps, Diverticulosis Resume previous diet  Continue present medications await pathology results  YOU HAD AN ENDOSCOPIC PROCEDURE TODAY AT [place]:   Refer to the procedure report that was given to you for any specific questions about what was found during the examination.  If the procedure report does not answer your questions, please call your gastroenterologist to clarify.  If you requested that your care partner not be given the details of your procedure findings, then the procedure report has been included in a sealed envelope for you to review at your convenience later.  YOU SHOULD EXPECT: Some feelings of bloating in the abdomen. Passage of more gas than usual.  Walking can help get rid of the air that was put into your GI tract during the procedure and reduce the bloating. If you had a lower endoscopy (such as a colonoscopy or flexible sigmoidoscopy) you may notice spotting of blood in your stool or on the toilet paper. If you underwent a bowel prep for your procedure, you may not have a normal bowel movement for a few days.  Please Note:  You might notice some irritation and congestion in your nose or some drainage.  This is from the oxygen used during your procedure.  There is no need for concern and it should clear up in a day or so.  SYMPTOMS TO REPORT IMMEDIATELY:   Following lower endoscopy (colonoscopy or flexible sigmoidoscopy):  Excessive amounts of blood in the stool  Significant tenderness or worsening of abdominal pains  Swelling of the abdomen that is new, acute  Fever of 100F or higher  For urgent or emergent issues, a gastroenterologist can be reached at any hour by calling [phone]. Do not use MyChart messaging for urgent concerns.    DIET:  We do recommend a small meal at first, but then you may proceed to your regular diet.  Drink plenty of fluids but you should avoid alcoholic beverages for 24 hours.  ACTIVITY:  You  should plan to take it easy for the rest of today and you should NOT DRIVE or use heavy machinery until tomorrow (because of the sedation medicines used during the test).    FOLLOW UP: Our staff will call the number listed on your records 48-72 hours following your procedure to check on you and address any questions or concerns that you may have regarding the information given to you following your procedure. If we do not reach you, we will leave a message.  We will attempt to reach you two times.  During this call, we will ask if you have developed any symptoms of COVID 19. If you develop any symptoms (ie: fever, flu-like symptoms, shortness of breath, cough etc.) before then, please call [phone].  If you test positive for Covid 19 in the 2 weeks post procedure, please call and report this information to [place].    If any biopsies were taken you will be contacted by phone or by letter within the next 1-3 weeks.  Please call us at [phone] if you have not heard about the biopsies in 3 weeks.    SIGNATURES/CONFIDENTIALITY: You and/or your care partner have signed paperwork which will be entered into your electronic medical record.  These signatures attest to the fact that that the information above on your After Visit Summary has been reviewed and is understood.  Full responsibility of the confidentiality of this discharge information lies with you and/or your care-partner.

## 2020-04-08 NOTE — Progress Notes (Signed)
Called to room to assist during endoscopic procedure.  Patient ID and intended procedure confirmed with present staff. Received instructions for my participation in the procedure from the performing physician.  

## 2020-04-08 NOTE — Op Note (Signed)
[place] Patient Name: Donna Richmond Procedure Date: 04/08/2020 2:00 PM MRN: [id number] Endoscopist: Jerene Bears , MD Age: 64 Referring MD:  Date of Birth: 05/05/1956 Gender: Female Account #: [id number] Procedure:                Colonoscopy Indications:              Disease activity assessment of Crohn's disease of                            the small bowel + distal rectal and perianal                            Crohn's disease, prior ileocecectomy in 2018 Medicines:                Monitored Anesthesia Care Procedure:                Pre-Anesthesia Assessment:                           - Prior to the procedure, a History and Physical                            was performed, and patient medications and                            allergies were reviewed. The patient's tolerance of                            previous anesthesia was also reviewed. The risks                            and benefits of the procedure and the sedation                            options and risks were discussed with the patient.                            All questions were answered, and informed consent                            was obtained. Prior Anticoagulants: The patient has                            taken no previous anticoagulant or antiplatelet                            agents. ASA Grade Assessment: II - A patient with                            mild systemic disease. After reviewing the risks                            and benefits, the patient was deemed in  satisfactory condition to undergo the procedure.                           After obtaining informed consent, the colonoscope                            was passed under direct vision. Throughout the                            procedure, the patient's blood pressure, pulse, and                            oxygen saturations were monitored continuously. The                            Colonoscope was  introduced through the anus and                            advanced to the cecum, identified by appendiceal                            orifice and ileocecal valve. The colonoscopy was                            performed without difficulty. The patient tolerated                            the procedure well. The quality of the bowel                            preparation was good. The ileocecal valve,                            appendiceal orifice, and rectum were photographed. Scope In: 2:12:41 PM Scope Out: 2:27:14 PM Scope Withdrawal Time: 0 hours 12 minutes 52 seconds  Total Procedure Duration: 0 hours 14 minutes 33 seconds  Findings:                 The digital rectal exam findings include anal                            stricture secondary to Crohn's disease. There are                            minor excoriation to the immediate perianal skin                            without visible fistula.                           There was evidence of a prior end-to-end                            ileo-colonic anastomosis at the ileocecal valve.  This was patent and was characterized by healthy                            appearing mucosa in the visible neo-terminal ileum.                           A 7 mm polyp was found in the cecum. The polyp was                            sessile. The polyp was removed with a cold snare.                            Resection and retrieval were complete (Jar 1).                           Normal mucosa was found in the entire colon. No                            evidence of active Crohn's disease in the colon.                            Biopsies were taken with a cold forceps for                            histology from the right and left colon (Jar 2 and                            Jar 3).                           A few small-mouthed diverticula were found in the                            sigmoid colon.                            There was evidence of mucosal rent in the anal                            stricture, with no additional abnormalities found                            on retroflexion. Complications:            No immediate complications. Estimated Blood Loss:     Estimated blood loss was minimal. Impression:               - Anal stricture found on digital rectal exam (due                            to Crohn's disease).                           - Ileo-colonic anastomosis, characterized by  healthy appearing mucosa.                           - One 7 mm polyp in the cecum, removed with a cold                            snare. Resected and retrieved.                           - Diverticulosis in the sigmoid colon.                           - Normal mucosa in the entire examined colon                            without evidence of active Crohn's today. Biopsied. Recommendation:           - Patient has a contact number available for                            emergencies. The signs and symptoms of potential                            delayed complications were discussed with the                            patient. Return to normal activities tomorrow.                            Written discharge instructions were provided to the                            patient.                           - Resume previous diet.                           - Continue present medications.                           - Await pathology results.                           - Office follow-up with me in 3-4 months.                           - Repeat colonoscopy is recommended. The                            colonoscopy date will be determined after pathology                            results from today's exam become available for  review. Jerene Bears, MD 04/08/2020 2:36:53 PM This report has been signed electronically.

## 2020-04-10 ENCOUNTER — Telehealth: Payer: Self-pay

## 2020-04-10 NOTE — Telephone Encounter (Signed)
  Follow up Call-  Call back number 04/08/2020  Post procedure Call Back phone  # [phone]  Permission to leave phone message Yes  Some recent data might be hidden     Patient questions:  Do you have a fever, pain , or abdominal swelling? No. Pain Score  0 *  Have you tolerated food without any problems? Yes.    Have you been able to return to your normal activities? Yes.    Do you have any questions about your discharge instructions: Diet   No. Medications  No. Follow up visit  No.  Do you have questions or concerns about your Care? No.  Actions: * If pain score is 4 or above: No action needed, pain <4.  1. Have you developed a fever since your procedure? no  2.   Have you had an respiratory symptoms (SOB or cough) since your procedure? no  3.   Have you tested positive for COVID 19 since your procedure no  4.   Have you had any family members/close contacts diagnosed with the COVID 19 since your procedure?  no   If yes to any of these questions please route to Joylene John, RN and Erenest Rasher, RN

## 2020-04-14 ENCOUNTER — Encounter: Payer: Self-pay | Admitting: Internal Medicine

## 2020-05-22 ENCOUNTER — Encounter: Payer: Self-pay | Admitting: *Deleted

## 2020-06-17 DIAGNOSIS — E782 Mixed hyperlipidemia: Secondary | ICD-10-CM | POA: Diagnosis not present

## 2020-06-24 ENCOUNTER — Encounter: Payer: Self-pay | Admitting: Internal Medicine

## 2020-06-24 ENCOUNTER — Ambulatory Visit (INDEPENDENT_AMBULATORY_CARE_PROVIDER_SITE_OTHER): Payer: BC Managed Care – PPO | Admitting: Internal Medicine

## 2020-06-24 ENCOUNTER — Other Ambulatory Visit: Payer: BC Managed Care – PPO

## 2020-06-24 VITALS — BP 152/70 | HR 48 | Ht 63.0 in | Wt 192.0 lb

## 2020-06-24 DIAGNOSIS — K5010 Crohn's disease of large intestine without complications: Secondary | ICD-10-CM

## 2020-06-24 DIAGNOSIS — K432 Incisional hernia without obstruction or gangrene: Secondary | ICD-10-CM

## 2020-06-24 DIAGNOSIS — Z8719 Personal history of other diseases of the digestive system: Secondary | ICD-10-CM | POA: Diagnosis not present

## 2020-06-24 DIAGNOSIS — K5000 Crohn's disease of small intestine without complications: Secondary | ICD-10-CM | POA: Diagnosis not present

## 2020-06-24 DIAGNOSIS — D849 Immunodeficiency, unspecified: Secondary | ICD-10-CM | POA: Diagnosis not present

## 2020-06-24 MED ORDER — DICYCLOMINE HCL 10 MG PO CAPS: ORAL_CAPSULE | ORAL | 2 refills | Status: DC

## 2020-06-24 NOTE — Patient Instructions (Addendum)
Continue current medications.  Your provider has requested that you go to the basement level for lab work before leaving today. Press "B" on the elevator. The lab is located at the first door on the left as you exit the elevator.  Call [facility] regarding your incisional hernia.  Follow up with Dr Hilarie Fredrickson in 6 months.  If you are age 64 or older, your body mass index should be between 23-30. Your Body mass index is 34.01 kg/m. If this is out of the aforementioned range listed, please consider follow up with your Primary Care Provider.  If you are age 68 or younger, your body mass index should be between 19-25. Your Body mass index is 34.01 kg/m. If this is out of the aformentioned range listed, please consider follow up with your Primary Care Provider.   Due to recent changes in healthcare laws, you may see the results of your imaging and laboratory studies on MyChart before your provider has had a chance to review them.  We understand that in some cases there may be results that are confusing or concerning to you. Not all laboratory results come back in the same time frame and the provider may be waiting for multiple results in order to interpret others.  Please give [place] 48 hours in order for your provider to thoroughly review all the results before contacting the office for clarification of your results.

## 2020-06-24 NOTE — Progress Notes (Signed)
Subjective:    Patient ID: Donna Richmond, female    DOB: 1956/01/17, 64 y.o.   MRN: [id number]  HPI Donna Richmond is a 64 year old female with a history of ileal Crohn's with perianal involvement status post laparoscopic ileocecectomy in April 2018 maintained on Stelara who is here for follow-up.  She was last seen in the office in June and for colonoscopy in July 2021.  She reports that she is doing well.  She has had two "spells" since colonoscopy of intense abdominal pain located near her ventral hernia.  This lasted for hours and was severe.  This is the only time she is use Bentyl.  She has to lie down and get off of her feet for the pain to resolve.  Outside of these episodes she is not having abdominal pain.  If she feels stress or anxiousness she will have loose stools with urgency.  She uses loperamide during these instances with success.  She has had easier time passing stool since colonoscopy as there was anal stenosis which was dilated.  She has had no blood in her stool or melena.  She does occasionally have some right hip discomfort when she lies on her right side.  No upper GI or hepatobiliary complaint.  She is doing well with Stelara and has the [facility] through September 2022.  She is trying to get disability and is no longer working.  Colonoscopy was performed on 04/08/2020.  This revealed anal stricture.  The ileocolonic anastomosis was patent and healthy-appearing.  A 7 mm sessile serrated polyp was removed from the cecum.  Otherwise normal mucosa throughout the colon.  Small mouth diverticulosis in the sigmoid.  Colonic biopsies showed no evidence of active or chronic colitis.  CT was performed after her last office visit, see below  Review of Systems As per HPI, otherwise negative  Current Medications, Allergies, Past Medical History, Past Surgical History, Family History and Social History were reviewed in [employer] record.       Objective:   Physical Exam BP (!) 152/70   Pulse (!) 48   Ht 5' 3"  (1.6 m)   Wt 192 lb (87.1 kg)   BMI 34.01 kg/m  Gen: awake, alert, NAD HEENT: anicteric CV: RRR, no mrg Pulm: CTA b/l Abd: soft, palpable large, approximately 10 cm incisional hernia above and just to the left of the midline incision, this is reducible when she is lying flat but very prominent when she is standing, nontender, +BS throughout Ext: no c/c/e Neuro: nonfocal  CBC    Component Value Date/Time   WBC 5.5 03/11/2020 1725   RBC 4.51 03/11/2020 1725   HGB 13.2 03/11/2020 1725   HCT 39.2 03/11/2020 1725   PLT 215.0 03/11/2020 1725   MCV 86.8 03/11/2020 1725   MCH 27.6 11/30/2016 0742   MCHC 33.7 03/11/2020 1725   RDW 13.3 03/11/2020 1725   LYMPHSABS 1.5 03/11/2020 1725   MONOABS 0.4 03/11/2020 1725   EOSABS 0.1 03/11/2020 1725   BASOSABS 0.0 03/11/2020 1725   CMP     Component Value Date/Time   NA 137 03/11/2020 1725   K 3.3 (L) 03/11/2020 1725   CL 102 03/11/2020 1725   CO2 27 03/11/2020 1725   GLUCOSE 95 03/11/2020 1725   BUN 23 03/11/2020 1725   CREATININE 0.83 03/11/2020 1725   CALCIUM 9.7 03/11/2020 1725   PROT 7.2 03/11/2020 1725   ALBUMIN 4.5 03/11/2020 1725   AST 19 03/11/2020 1725  ALT 18 03/11/2020 1725   ALKPHOS 66 03/11/2020 1725   BILITOT 0.5 03/11/2020 1725   GFRNONAA >60 12/01/2016 0525   GFRAA >60 12/01/2016 0525   CT ABDOMEN AND PELVIS WITH CONTRAST (ENTEROGRAPHY)   TECHNIQUE: Multidetector CT of the abdomen and pelvis during bolus administration of intravenous contrast. Negative oral contrast was given.   CONTRAST:  133m OMNIPAQUE IOHEXOL 300 MG/ML  SOLN   COMPARISON:  11/29/2016   FINDINGS: Lower chest: Mild descending thoracic aortic atherosclerotic calcification.   Hepatobiliary: Unremarkable   Pancreas: Unremarkable   Spleen: Unremarkable   Adrenals/Urinary Tract: Adrenal glands normal. In the left lateral kidney, a 0.4 by 0.3 cm angiomyolipoma  is present on image 89/7. Due to pelvic floor laxity, a cystocele is present and extends 1.5 cm below the pubococcygeal line.   Stomach/Bowel: Small periampullary duodenal diverticulum.   Normal bowel caliber. No significant abnormal bowel wall thickening. Expected jejunal folds. No significant inflammatory findings in the distal ileum or ileocecal anastomosis site. No significant abnormal accentuated mucosal enhancement there is a short segment nondistended proximal transverse colon thought to be incidental.   Low position of the anorectal junction due to pelvic floor laxity. I do not see a well-defined perianal fistula although protocol specific MRI does have substantially better sensitivity.   Vascular/Lymphatic: Aortoiliac atherosclerotic vascular disease. No pathologic adenopathy.   Reproductive: Low position of the cervix due to pelvic floor laxity without overt uterine prolapse. Adnexa unremarkable.   Other: No supplemental non-categorized findings.   Musculoskeletal: Left eccentric periumbilical hernia containing omental adipose tissue. Herniated tissue measures 11.9 by 4.5 by 6.1 cm (volume = 170 cm^3). The hernia neck measures 3.4 by 6.1 cm. Transverse colon extends slightly towards the hernia neck but is not herniated itself.   No sacroiliac joint pathology is identified. Mild disc bulge at L4-5.   IMPRESSION: 1. No findings of active Crohn's disease. 2. Left eccentric periumbilical hernia containing omental adipose tissue. 3. Pelvic floor laxity with cystocele. 4. Small periampullary duodenal diverticulum. 5. Very small left lateral renal angiomyolipoma. This does not require further workup. 6. Mild disc bulge at L4-5. 7. Aortic atherosclerosis.   Aortic Atherosclerosis (ICD10-I70.0).     Electronically Signed   By: WVan ClinesM.D.   On: 03/18/2020 12:30        Assessment & Plan:  64year old female with a history of ileal Crohn's with  perianal involvement status post laparoscopic ileocecectomy in April 2018 maintained on Stelara who is here for follow-up.  1.  Ileal and perianal Crohn's disease/prior ileocecectomy (diagnosis greater than 10 years) --from a Crohn's perspective she is doing very well on Stelara alone.  Her colonoscopy did not show any evidence of active disease after ileocecectomy and CT confirmed the same.  Her abdominal pain episodes are felt directly related to her incisional hernia which we discussed today --Continue Stelara at current dose; [facility] when her current coverage will run out --May need repeat anal stenosis dilation under monitored anesthesia care, repeat as needed --Bentyl can be used 10 to 20 mg 3 times daily as needed for abdominal pain --Continue loperamide 2 to 4 mg 3 times daily as needed --Continue Zofran 4 mg every 6-8 hours as needed for nausea  2.  Incisional ventral hernia --this is large and has caused the two attacks of abdominal pain in the last 2 months.  I think it would be wise to have this repaired  so that complication does not occur.  I told her that I felt like repair was inevitable and easier when it is not acutely inflamed or incarcerated.  She is in agreement.  She is discussed this with Dr. Drue Flirt who was going to refer her to hernia surgeon within the [facility] where she has been cared for previously.  She will contact Dr. Geronimo Boot office for this surgical consult  45-monthfollow-up  30 minutes total spent today including patient facing time, coordination of care, reviewing medical history/procedures/pertinent radiology studies, and documentation of the encounter.

## 2020-06-26 LAB — QUANTIFERON-TB GOLD PLUS
Mitogen-NIL: >10 IU/mL
NIL: 0.03 IU/mL
QuantiFERON-TB Gold Plus: NEGATIVE
TB1-NIL: 0 IU/mL
TB2-NIL: 0 IU/mL

## 2020-06-30 DIAGNOSIS — R109 Unspecified abdominal pain: Secondary | ICD-10-CM | POA: Diagnosis not present

## 2020-06-30 DIAGNOSIS — K432 Incisional hernia without obstruction or gangrene: Secondary | ICD-10-CM | POA: Diagnosis not present

## 2020-07-04 ENCOUNTER — Telehealth: Payer: Self-pay | Admitting: Internal Medicine

## 2020-07-04 DIAGNOSIS — I700 Atherosclerosis of aorta: Secondary | ICD-10-CM | POA: Diagnosis not present

## 2020-07-04 DIAGNOSIS — E782 Mixed hyperlipidemia: Secondary | ICD-10-CM | POA: Diagnosis not present

## 2020-07-04 DIAGNOSIS — N182 Chronic kidney disease, stage 2 (mild): Secondary | ICD-10-CM | POA: Diagnosis not present

## 2020-07-04 DIAGNOSIS — Z23 Encounter for immunization: Secondary | ICD-10-CM | POA: Diagnosis not present

## 2020-07-04 DIAGNOSIS — I129 Hypertensive chronic kidney disease with stage 1 through stage 4 chronic kidney disease, or unspecified chronic kidney disease: Secondary | ICD-10-CM | POA: Diagnosis not present

## 2020-07-04 NOTE — Telephone Encounter (Signed)
Pt would like to know when the last time that she had a pneumonia shot was. She stated that she had it at the office

## 2020-07-04 NOTE — Telephone Encounter (Signed)
She can go ahead with 23 Thanks

## 2020-07-04 NOTE — Telephone Encounter (Signed)
Spoke with pt and let her know her last pneumonia vaccine was 09/06/19. Pt having hernia surgery and wants to know when she needs to repeat the pneumonia vaccine. Please advise.

## 2020-07-04 NOTE — Telephone Encounter (Signed)
We need to be sure she has had both pneumonia vaccines.  Pneumovax 13 and 23.  If she has only had 1 she can and should have the other.

## 2020-07-04 NOTE — Telephone Encounter (Signed)
Pneumovax 13 is the one documented.

## 2020-07-07 ENCOUNTER — Other Ambulatory Visit: Payer: Self-pay

## 2020-07-07 NOTE — Telephone Encounter (Signed)
Left message for pt that she needs to get the pneumovax 23 and she can get that with our office or with her PCP. Instructed pt to call back if she wants to get it at our office, otherwise she can contact her PCP.

## 2020-07-17 ENCOUNTER — Other Ambulatory Visit: Payer: Self-pay | Admitting: Obstetrics and Gynecology

## 2020-07-17 DIAGNOSIS — Z1231 Encounter for screening mammogram for malignant neoplasm of breast: Secondary | ICD-10-CM

## 2020-07-25 ENCOUNTER — Ambulatory Visit (INDEPENDENT_AMBULATORY_CARE_PROVIDER_SITE_OTHER): Payer: BC Managed Care – PPO | Admitting: Internal Medicine

## 2020-07-25 DIAGNOSIS — Z23 Encounter for immunization: Secondary | ICD-10-CM | POA: Diagnosis not present

## 2020-07-25 DIAGNOSIS — K5000 Crohn's disease of small intestine without complications: Secondary | ICD-10-CM

## 2020-09-10 ENCOUNTER — Telehealth: Payer: Self-pay | Admitting: Internal Medicine

## 2020-09-10 NOTE — Telephone Encounter (Signed)
Pt is requesting a call back from a nurse to discuss a referral for her to see a Psychologist, sport and exercise.

## 2020-09-10 NOTE — Telephone Encounter (Signed)
Pt states she was scheduled for hernia repair surgery at [facility] with Dr. Florene Glen and it was cancelled due to covid. She has tried to get in touch with them to reschedule and cannot get a call back. Pt wants know who Dr. Hilarie Fredrickson would recommend for her hernia repair. Please advise.

## 2020-09-11 ENCOUNTER — Other Ambulatory Visit: Payer: Self-pay

## 2020-09-11 ENCOUNTER — Ambulatory Visit
Admission: RE | Admit: 2020-09-11 | Discharge: 2020-09-11 | Disposition: A | Discharge status: Home / Self Care | Payer: BC Managed Care – PPO | Source: Ambulatory Visit | Attending: Obstetrics and Gynecology | Admitting: Obstetrics and Gynecology

## 2020-09-11 DIAGNOSIS — Z1231 Encounter for screening mammogram for malignant neoplasm of breast: Secondary | ICD-10-CM | POA: Diagnosis not present

## 2020-09-11 DIAGNOSIS — Z6834 Body mass index (BMI) 34.0-34.9, adult: Secondary | ICD-10-CM | POA: Diagnosis not present

## 2020-09-11 DIAGNOSIS — Z01419 Encounter for gynecological examination (general) (routine) without abnormal findings: Secondary | ICD-10-CM | POA: Diagnosis not present

## 2020-09-11 NOTE — Telephone Encounter (Signed)
Please let patient know that I communicated by text with Dr. Drue Flirt She does not think it will be a problem for her to get in and have her hernia repaired.  Dr. Drue Flirt said she will have staff reach out to Donna Richmond soon to get it scheduled Patient can let us know if she does not hear from [facility] soon

## 2020-09-12 NOTE — Telephone Encounter (Signed)
Spoke with pt and she is aware and will let us know if she does not hear from [facility] soon.

## 2020-10-30 ENCOUNTER — Telehealth: Payer: Self-pay | Admitting: Internal Medicine

## 2020-10-30 NOTE — Telephone Encounter (Signed)
She will not go on [employer] until 01/02/2021 We can look for an appt in Feb, and place on cancel list If Feb proceeds without cancellation we can look for an overbook slot later in the month

## 2020-10-30 NOTE — Telephone Encounter (Signed)
Pt is getting ready to go on Medicare and she is asking to be worked in to see Dr. Hilarie Fredrickson. She has questions about her crohns meds and if he plans to change anything with her meds. She is working with someone that is helping her sign up and she is trying make sure she has everything taken care of before March 1. Please advise if pt can be overbooked.

## 2020-10-30 NOTE — Telephone Encounter (Signed)
Patient called has questions on a medication did not mention which one and also wants to talk about possibly working her in the schedule

## 2020-10-31 NOTE — Telephone Encounter (Signed)
Pt scheduled to see Dr. Hilarie Fredrickson 3/3@10 :10am. Pt aware of appt and placed on cancellation list.

## 2020-11-05 DIAGNOSIS — E782 Mixed hyperlipidemia: Secondary | ICD-10-CM | POA: Diagnosis not present

## 2020-11-10 DIAGNOSIS — Z6832 Body mass index (BMI) 32.0-32.9, adult: Secondary | ICD-10-CM | POA: Diagnosis not present

## 2020-11-10 DIAGNOSIS — K5090 Crohn's disease, unspecified, without complications: Secondary | ICD-10-CM | POA: Diagnosis not present

## 2020-11-10 DIAGNOSIS — N182 Chronic kidney disease, stage 2 (mild): Secondary | ICD-10-CM | POA: Diagnosis not present

## 2020-11-10 DIAGNOSIS — Z23 Encounter for immunization: Secondary | ICD-10-CM | POA: Diagnosis not present

## 2020-11-10 DIAGNOSIS — I129 Hypertensive chronic kidney disease with stage 1 through stage 4 chronic kidney disease, or unspecified chronic kidney disease: Secondary | ICD-10-CM | POA: Diagnosis not present

## 2020-11-10 DIAGNOSIS — E782 Mixed hyperlipidemia: Secondary | ICD-10-CM | POA: Diagnosis not present

## 2020-12-01 DIAGNOSIS — K5090 Crohn's disease, unspecified, without complications: Secondary | ICD-10-CM | POA: Diagnosis not present

## 2020-12-01 DIAGNOSIS — N182 Chronic kidney disease, stage 2 (mild): Secondary | ICD-10-CM | POA: Diagnosis not present

## 2020-12-01 DIAGNOSIS — E2839 Other primary ovarian failure: Secondary | ICD-10-CM | POA: Diagnosis not present

## 2020-12-01 DIAGNOSIS — I129 Hypertensive chronic kidney disease with stage 1 through stage 4 chronic kidney disease, or unspecified chronic kidney disease: Secondary | ICD-10-CM | POA: Diagnosis not present

## 2020-12-04 ENCOUNTER — Other Ambulatory Visit: Payer: Self-pay

## 2020-12-04 ENCOUNTER — Ambulatory Visit (INDEPENDENT_AMBULATORY_CARE_PROVIDER_SITE_OTHER): Payer: BC Managed Care – PPO | Admitting: Internal Medicine

## 2020-12-04 ENCOUNTER — Encounter: Payer: Self-pay | Admitting: Internal Medicine

## 2020-12-04 VITALS — BP 150/70 | HR 64 | Ht 62.75 in | Wt 190.5 lb

## 2020-12-04 DIAGNOSIS — K580 Irritable bowel syndrome with diarrhea: Secondary | ICD-10-CM | POA: Diagnosis not present

## 2020-12-04 DIAGNOSIS — K5010 Crohn's disease of large intestine without complications: Secondary | ICD-10-CM | POA: Diagnosis not present

## 2020-12-04 DIAGNOSIS — K5000 Crohn's disease of small intestine without complications: Secondary | ICD-10-CM

## 2020-12-04 DIAGNOSIS — K432 Incisional hernia without obstruction or gangrene: Secondary | ICD-10-CM

## 2020-12-04 NOTE — Progress Notes (Signed)
Subjective:    Patient ID: Donna Richmond, female    DOB: 02-13-1956, 65 y.o.   MRN: [id number]  HPI Donna Richmond is a 65 year old female with a history of ileal Crohn's with perianal involvement status post ileocecectomy in April 2018 maintained on Stelara, umbilical incisional hernia and IBS who is seen for follow-up.  She is here today with her husband and she was last seen in the office on 06/24/2020.  She reports from a Crohn's perspective she is doing very well.  She continues Stelara injections without issue though she is worried as she will become a Medicare patient next month.  She is fearful that Stelara will not be covered will be unaffordable.  It has worked very well for her.  Her next dose is due later this week.  She has not had any prolonged attacks of abdominal pain other than her umbilical hernia pain (see next paragraph).  Bowel habits have been regular.  No perianal swelling, difficult defecation, constipation or diarrhea.  No blood in stool or melena.  Energy levels have been good.  If she gets anxious or nervous on a particular day she can have loose stools and Imodium works well for this.  She has had multiple episodes of periumbilical pain and tenderness.  At times she just has to lie down for this pain.  Bentyl is taken when these episodes occur and seems to help.  She was seen at [facility] by a general surgeon under the direction of Dr. Drue Flirt for umbilical hernia repair.  This is with Dr. Florene Glen.  She had a CT scan which he ordered on 06/30/2020 which showed a fat/omental containing supraumbilical hernia without associated complication.  Surgical repair was discussed but she reports she was never contacted back and was told that the surgery could not be performed due to the COVID-19 pandemic and restrictions on operative services.  She very much wishes to have her hernia fixed.   Review of Systems As per HPI, otherwise negative  Current Medications, Allergies, Past  Medical History, Past Surgical History, Family History and Social History were reviewed in [employer] record.     Objective:   Physical Exam BP (!) 150/70 (BP Location: Left Arm, Patient Position: Sitting, Cuff Size: Normal)   Pulse 64 Comment: irregular  Ht 5' 2.75" (1.594 m) Comment: height measured without shoes  Wt 190 lb 8 oz (86.4 kg)   BMI 34.01 kg/m  Gen: awake, alert, NAD HEENT: anicteric  CV: RRR, no mrg Pulm: CTA b/l Abd: soft, NT/ND, +BS throughout, palpable umbilical hernia which is not tender today Ext: no c/c/e Neuro: nonfocal  CBC    Component Value Date/Time   WBC 5.5 03/11/2020 1725   RBC 4.51 03/11/2020 1725   HGB 13.2 03/11/2020 1725   HCT 39.2 03/11/2020 1725   PLT 215.0 03/11/2020 1725   MCV 86.8 03/11/2020 1725   MCH 27.6 11/30/2016 0742   MCHC 33.7 03/11/2020 1725   RDW 13.3 03/11/2020 1725   LYMPHSABS 1.5 03/11/2020 1725   MONOABS 0.4 03/11/2020 1725   EOSABS 0.1 03/11/2020 1725   BASOSABS 0.0 03/11/2020 1725   CMP     Component Value Date/Time   NA 137 03/11/2020 1725   K 3.3 (L) 03/11/2020 1725   CL 102 03/11/2020 1725   CO2 27 03/11/2020 1725   GLUCOSE 95 03/11/2020 1725   BUN 23 03/11/2020 1725   CREATININE 0.83 03/11/2020 1725   CALCIUM 9.7 03/11/2020 1725  PROT 7.2 03/11/2020 1725   ALBUMIN 4.5 03/11/2020 1725   AST 19 03/11/2020 1725   ALT 18 03/11/2020 1725   ALKPHOS 66 03/11/2020 1725   BILITOT 0.5 03/11/2020 1725   GFRNONAA >60 12/01/2016 0525   GFRAA >60 12/01/2016 0525   Quantiferon gold negative      Assessment & Plan:  65 year old female with a history of ileal Crohn's with perianal involvement status post ileocecectomy in April 2018 maintained on Stelara, umbilical incisional hernia and IBS who is seen for follow-up.  1.  Ileal and perianal Crohn's disease/prior ileocecectomy in 2018, diagnosis greater than 10 years ago --Delsa Grana has been extremely effective for her and she has not had a  flare of her of Crohn's disease in a number of years now.  We discussed given the efficacy of Stelara I think that she should definitely continue this therapy.  She has had prior complications with ileal stricture as well as perianal fistulae. --Continue Stelara 90 mg subcutaneous every 8 weeks; she will be working to select the most appropriate Medicare plan so that the medication can be covered.  We may need to provide additional documentation so that this medication can continue on her  2.  IBS with loose stools --we discussed how her acute loose stools which occur at times of anxiousness or stress is more irritable bowel and not related to Crohn's disease.  She treats this very effectively with as needed loperamide and dicyclomine --Continue dicyclomine 10 to 20 mg 3 times daily as needed and loperamide per box instruction as needed  3.  Umbilical hernia --intermittently symptomatic and painful for her.  I recommended repair.  [facility] was unable to schedule his surgery due to COVID-19 pandemic restrictions.  I think that this could be handled successfully here in town and I am going to refer her to [facility] for consultation and repair of her incisional umbilical hernia.  30 minutes total spent today including patient facing time, coordination of care, reviewing medical history/procedures/pertinent radiology studies, and documentation of the encounter.

## 2020-12-04 NOTE — Patient Instructions (Signed)
Please remain on your Stelara.  Please contact us once you have new insurance cards and with your specialty pharmacy when you switch to Medicare  Take your dicyclomine and loperamide as needed.   You will be contacted directly by [facility] to schedule a consult for your symptomatic incisional umbilical hernia.  Dr. Hilarie Fredrickson has requested you be seen by Dr. Emily Filbert, or [place].   Due to recent changes in healthcare laws, you may see the results of your imaging and laboratory studies on MyChart before your provider has had a chance to review them.  We understand that in some cases there may be results that are confusing or concerning to you. Not all laboratory results come back in the same time frame and the provider may be waiting for multiple results in order to interpret others.  Please give [place] 48 hours in order for your provider to thoroughly review all the results before contacting the office for clarification of your results.    If you are age 57 or older, your body mass index should be between 23-30. Your Body mass index is 34.01 kg/m. If this is out of the aforementioned range listed, please consider follow up with your Primary Care Provider.  If you are age 42 or younger, your body mass index should be between 19-25. Your Body mass index is 34.01 kg/m. If this is out of the aformentioned range listed, please consider follow up with your Primary Care Provider.

## 2020-12-05 ENCOUNTER — Telehealth: Payer: Self-pay | Admitting: Internal Medicine

## 2020-12-05 NOTE — Telephone Encounter (Signed)
Patient notified via VM that I am still waiting to hear back from the [facility] rep with the information about Medicare and Stelara coverage.

## 2020-12-08 ENCOUNTER — Other Ambulatory Visit: Payer: Self-pay | Admitting: Family Medicine

## 2020-12-08 DIAGNOSIS — M8580 Other specified disorders of bone density and structure, unspecified site: Secondary | ICD-10-CM

## 2020-12-08 NOTE — Telephone Encounter (Signed)
Patient notified to contact [facility]) [phone]. Patient states she has heard from [facility] they have her referral and they will be contacting her soon with an appointment date and time.

## 2020-12-09 ENCOUNTER — Other Ambulatory Visit: Payer: Self-pay | Admitting: Family Medicine

## 2020-12-09 DIAGNOSIS — Z13820 Encounter for screening for osteoporosis: Secondary | ICD-10-CM

## 2020-12-19 ENCOUNTER — Ambulatory Visit: Payer: Self-pay | Admitting: Surgery

## 2020-12-19 DIAGNOSIS — K432 Incisional hernia without obstruction or gangrene: Secondary | ICD-10-CM | POA: Diagnosis not present

## 2020-12-19 NOTE — Telephone Encounter (Addendum)
Patient brought information for new insurance by office. Now has [employer].  RxID: [id number] RxGroup: [id number] RxBin: [place] RxPCN: [id number]  Enrollment begins 01/02/21.  Member services phone number is [phone].   She states this information was requested so she could get Stelara coverage.

## 2020-12-19 NOTE — Progress Notes (Signed)
COVID Vaccine Completed: Date COVID Vaccine completed: Has received booster: COVID vaccine manufacturer: [place]  Date of COVID positive in last 90 days:  PCP - Marco Collie, MD Cardiologist -   Chest x-ray -  EKG -  Stress Test -  ECHO -  Cardiac Cath -  Pacemaker/ICD device last checked:  Sleep Study -  CPAP -   Fasting Blood Sugar -  Checks Blood Sugar _____ times a day  Blood Thinner Instructions: Aspirin Instructions: Last Dose:  Activity level:  Unable to go up a flight of stairs without symptoms   Can go up a flight of stairs and activities of daily living without stopping and without symptoms   Able to exercise without symptoms     Anesthesia review:   Patient denies shortness of breath, fever, cough and chest pain at PAT appointment   Patient verbalized understanding of instructions that were given to them at the PAT appointment. Patient was also instructed that they will need to review over the PAT instructions again at home before surgery.

## 2020-12-22 ENCOUNTER — Other Ambulatory Visit (HOSPITAL_COMMUNITY)
Admission: RE | Admit: 2020-12-22 | Discharge: 2020-12-22 | Disposition: A | Discharge status: Home / Self Care | Payer: BC Managed Care – PPO | Source: Ambulatory Visit | Attending: Surgery | Admitting: Surgery

## 2020-12-22 ENCOUNTER — Other Ambulatory Visit: Payer: Self-pay

## 2020-12-22 ENCOUNTER — Encounter (HOSPITAL_COMMUNITY): Payer: Self-pay | Admitting: Surgery

## 2020-12-22 DIAGNOSIS — Z20822 Contact with and (suspected) exposure to covid-19: Secondary | ICD-10-CM | POA: Diagnosis not present

## 2020-12-22 DIAGNOSIS — I10 Essential (primary) hypertension: Secondary | ICD-10-CM | POA: Diagnosis not present

## 2020-12-22 DIAGNOSIS — Z9851 Tubal ligation status: Secondary | ICD-10-CM | POA: Diagnosis not present

## 2020-12-22 DIAGNOSIS — Z01812 Encounter for preprocedural laboratory examination: Secondary | ICD-10-CM | POA: Insufficient documentation

## 2020-12-22 DIAGNOSIS — E785 Hyperlipidemia, unspecified: Secondary | ICD-10-CM | POA: Diagnosis not present

## 2020-12-22 DIAGNOSIS — E538 Deficiency of other specified B group vitamins: Secondary | ICD-10-CM | POA: Diagnosis not present

## 2020-12-22 DIAGNOSIS — K432 Incisional hernia without obstruction or gangrene: Secondary | ICD-10-CM | POA: Diagnosis not present

## 2020-12-22 DIAGNOSIS — Z9049 Acquired absence of other specified parts of digestive tract: Secondary | ICD-10-CM | POA: Diagnosis not present

## 2020-12-22 DIAGNOSIS — D500 Iron deficiency anemia secondary to blood loss (chronic): Secondary | ICD-10-CM | POA: Diagnosis not present

## 2020-12-22 DIAGNOSIS — K5010 Crohn's disease of large intestine without complications: Secondary | ICD-10-CM | POA: Diagnosis not present

## 2020-12-22 DIAGNOSIS — Z881 Allergy status to other antibiotic agents status: Secondary | ICD-10-CM | POA: Diagnosis not present

## 2020-12-22 MED ORDER — BUPIVACAINE LIPOSOME 1.3 % IJ SUSP
20.0000 mL | Freq: Once | INTRAMUSCULAR | Status: DC
  Filled 2020-12-22: qty 20

## 2020-12-22 NOTE — Telephone Encounter (Signed)
Referral for stelara sent to [facility] with the new insurance information.

## 2020-12-22 NOTE — Progress Notes (Signed)
COVID Vaccine Completed:  x3 Date COVID Vaccine completed: Has received booster: COVID vaccine manufacturer: Pfizer     Date of COVID positive in last 90 days:  N/A  PCP - Marco Collie, MD Cardiologist - N/A  Chest x-ray - N/A EKG - N/A Stress Test -  ECHO -  Cardiac Cath -  Pacemaker/ICD device last checked:  Sleep Study - N/A CPAP -   Fasting Blood Sugar - N/A Checks Blood Sugar _____ times a day  Blood Thinner Instructions: N/a Aspirin Instructions: Last Dose:  Activity level:      Can go up a flight of stairs and perform activities of daily living without stopping and without symptoms of chest pain or shortness of breath.                                                 Anesthesia review: N/A  Patient denies shortness of breath, fever, cough and chest pain at PAT appointment   Patient verbalized understanding of instructions that were given to them at the PAT appointment. Patient was also instructed that they will need to review over the PAT instructions again at home before surgery.

## 2020-12-22 NOTE — Anesthesia Preprocedure Evaluation (Addendum)
Anesthesia Evaluation  Patient identified by MRN, date of birth, ID band Patient awake    Reviewed: Allergy & Precautions, NPO status , Patient's Chart, lab work & pertinent test results, reviewed documented beta blocker date and time   Airway Mallampati: II  TM Distance: >3 FB Neck ROM: Full    Dental no notable dental hx. (+) Teeth Intact   Pulmonary neg pulmonary ROS,    Pulmonary exam normal breath sounds clear to auscultation       Cardiovascular hypertension, Pt. on medications Normal cardiovascular exam Rhythm:Regular Rate:Normal     Neuro/Psych negative neurological ROS  negative psych ROS   GI/Hepatic Neg liver ROS, PUD, Crohn's disease- stable for last 10 years on stelara Hx/o perirectal fistula Hx/o ileocectomy IBS Incisional hernia   Endo/Other  Hyperlipidemia Obesity  Renal/GU negative Renal ROS   Cystocele    Musculoskeletal negative musculoskeletal ROS (+)   Abdominal (+) + obese,   Peds  Hematology  (+) anemia ,   Anesthesia Other Findings   Reproductive/Obstetrics                            Anesthesia Physical Anesthesia Plan  ASA: II  Anesthesia Plan: General   Post-op Pain Management:    Induction: Intravenous  PONV Risk Score and Plan: 4 or greater and Treatment may vary due to age or medical condition, Ondansetron and Scopolamine patch - Pre-op  Airway Management Planned: Oral ETT  Additional Equipment:   Intra-op Plan:   Post-operative Plan: Extubation in OR  Informed Consent: I have reviewed the patients History and Physical, chart, labs and discussed the procedure including the risks, benefits and alternatives for the proposed anesthesia with the patient or authorized representative who has indicated his/her understanding and acceptance.     Dental advisory given  Plan Discussed with: CRNA and Anesthesiologist  Anesthesia Plan Comments:         Anesthesia Quick Evaluation

## 2020-12-23 ENCOUNTER — Encounter (HOSPITAL_COMMUNITY)
Admission: RE | Disposition: A | Discharge status: Home / Self Care | Payer: Self-pay | Source: Home / Self Care | Attending: Surgery

## 2020-12-23 ENCOUNTER — Inpatient Hospital Stay (HOSPITAL_COMMUNITY): Payer: BC Managed Care – PPO | Admitting: Anesthesiology

## 2020-12-23 ENCOUNTER — Inpatient Hospital Stay (HOSPITAL_COMMUNITY)
Admission: RE | Admit: 2020-12-23 | Discharge: 2020-12-24 | DRG: 354 | Disposition: A | Discharge status: Home / Self Care | Payer: BC Managed Care – PPO | Attending: Surgery | Admitting: Surgery

## 2020-12-23 ENCOUNTER — Encounter (HOSPITAL_COMMUNITY): Payer: Self-pay | Admitting: Surgery

## 2020-12-23 DIAGNOSIS — Z20822 Contact with and (suspected) exposure to covid-19: Secondary | ICD-10-CM | POA: Diagnosis present

## 2020-12-23 DIAGNOSIS — K5010 Crohn's disease of large intestine without complications: Secondary | ICD-10-CM | POA: Diagnosis present

## 2020-12-23 DIAGNOSIS — Z881 Allergy status to other antibiotic agents status: Secondary | ICD-10-CM | POA: Diagnosis not present

## 2020-12-23 DIAGNOSIS — Z9851 Tubal ligation status: Secondary | ICD-10-CM | POA: Diagnosis not present

## 2020-12-23 DIAGNOSIS — I10 Essential (primary) hypertension: Secondary | ICD-10-CM | POA: Diagnosis present

## 2020-12-23 DIAGNOSIS — E538 Deficiency of other specified B group vitamins: Secondary | ICD-10-CM | POA: Diagnosis present

## 2020-12-23 DIAGNOSIS — K432 Incisional hernia without obstruction or gangrene: Principal | ICD-10-CM | POA: Diagnosis present

## 2020-12-23 DIAGNOSIS — Z9049 Acquired absence of other specified parts of digestive tract: Secondary | ICD-10-CM

## 2020-12-23 HISTORY — PX: INCISIONAL HERNIA REPAIR: SHX193

## 2020-12-23 LAB — BASIC METABOLIC PANEL
Anion gap: 11 (ref 5–15)
BUN: 17 mg/dL (ref 8–23)
CO2: 25 mmol/L (ref 22–32)
Calcium: 9.4 mg/dL (ref 8.9–10.3)
Chloride: 103 mmol/L (ref 98–111)
Creatinine, Ser: 0.86 mg/dL (ref 0.44–1.00)
GFR, Estimated: >60 mL/min (ref >60)
Glucose, Bld: 103 mg/dL — ABNORMAL HIGH (ref 70–99)
Potassium: 3 mmol/L — ABNORMAL LOW (ref 3.5–5.1)
Sodium: 139 mmol/L (ref 135–145)

## 2020-12-23 LAB — CBC
HCT: 42.6 % (ref 36.0–46.0)
Hemoglobin: 14.2 g/dL (ref 12.0–15.0)
MCH: 29.7 pg (ref 26.0–34.0)
MCHC: 33.3 g/dL (ref 30.0–36.0)
MCV: 89.1 fL (ref 80.0–100.0)
Platelets: 207 10*3/uL (ref 150–400)
RBC: 4.78 MIL/uL (ref 3.87–5.11)
RDW: 12.8 % (ref 11.5–15.5)
WBC: 5.1 10*3/uL (ref 4.0–10.5)
nRBC: 0 % (ref 0.0–0.2)

## 2020-12-23 LAB — SARS CORONAVIRUS 2 (TAT 6-24 HRS): SARS Coronavirus 2: NEGATIVE

## 2020-12-23 SURGERY — REPAIR, HERNIA, INCISIONAL
Anesthesia: General | Site: Abdomen

## 2020-12-23 MED ORDER — MIDAZOLAM HCL 2 MG/2ML IJ SOLN
INTRAMUSCULAR | Status: AC
  Filled 2020-12-23: qty 2

## 2020-12-23 MED ORDER — HYDROMORPHONE HCL 1 MG/ML IJ SOLN: 0.5000 mg | INTRAMUSCULAR | Status: DC | PRN

## 2020-12-23 MED ORDER — ENOXAPARIN SODIUM 40 MG/0.4ML ~~LOC~~ SOLN
40.0000 mg | SUBCUTANEOUS | Status: DC
  Administered 2020-12-24: 40 mg via SUBCUTANEOUS
  Filled 2020-12-23: qty 0.4

## 2020-12-23 MED ORDER — HYDROCHLOROTHIAZIDE 12.5 MG PO CAPS
12.5000 mg | ORAL_CAPSULE | Freq: Every day | ORAL | Status: DC
  Administered 2020-12-24: 12.5 mg via ORAL
  Filled 2020-12-23: qty 1

## 2020-12-23 MED ORDER — ONDANSETRON HCL 4 MG/2ML IJ SOLN: 4.0000 mg | Freq: Once | INTRAMUSCULAR | Status: DC | PRN

## 2020-12-23 MED ORDER — ENOXAPARIN SODIUM 40 MG/0.4ML ~~LOC~~ SOLN
40.0000 mg | Freq: Once | SUBCUTANEOUS | Status: AC
  Administered 2020-12-23: 40 mg via SUBCUTANEOUS
  Filled 2020-12-23: qty 0.4

## 2020-12-23 MED ORDER — LIDOCAINE HCL (CARDIAC) PF 100 MG/5ML IV SOSY
PREFILLED_SYRINGE | INTRAVENOUS | Status: DC | PRN
  Administered 2020-12-23: 60 mg via INTRAVENOUS

## 2020-12-23 MED ORDER — KETOROLAC TROMETHAMINE 15 MG/ML IJ SOLN
15.0000 mg | Freq: Three times a day (TID) | INTRAMUSCULAR | Status: DC
  Administered 2020-12-23 – 2020-12-24 (×3): 15 mg via INTRAVENOUS
  Filled 2020-12-23 (×3): qty 1

## 2020-12-23 MED ORDER — PROPOFOL 10 MG/ML IV BOLUS
INTRAVENOUS | Status: AC
  Filled 2020-12-23: qty 20

## 2020-12-23 MED ORDER — GABAPENTIN 300 MG PO CAPS
300.0000 mg | ORAL_CAPSULE | ORAL | Status: AC
  Administered 2020-12-23: 300 mg via ORAL
  Filled 2020-12-23: qty 1

## 2020-12-23 MED ORDER — AMLODIPINE BESYLATE 5 MG PO TABS
5.0000 mg | ORAL_TABLET | Freq: Every day | ORAL | Status: DC
  Administered 2020-12-24: 5 mg via ORAL
  Filled 2020-12-23: qty 1

## 2020-12-23 MED ORDER — LACTATED RINGERS IV SOLN: INTRAVENOUS | Status: DC

## 2020-12-23 MED ORDER — MIDAZOLAM HCL 5 MG/5ML IJ SOLN
INTRAMUSCULAR | Status: DC | PRN
  Administered 2020-12-23: 2 mg via INTRAVENOUS

## 2020-12-23 MED ORDER — BUPIVACAINE-EPINEPHRINE 0.25% -1:200000 IJ SOLN
INTRAMUSCULAR | Status: DC | PRN
  Administered 2020-12-23: 30 mL

## 2020-12-23 MED ORDER — GABAPENTIN 300 MG PO CAPS
300.0000 mg | ORAL_CAPSULE | Freq: Three times a day (TID) | ORAL | Status: DC
  Administered 2020-12-23 – 2020-12-24 (×3): 300 mg via ORAL
  Filled 2020-12-23 (×3): qty 1

## 2020-12-23 MED ORDER — ACETAMINOPHEN 500 MG PO TABS
1000.0000 mg | ORAL_TABLET | ORAL | Status: AC
  Administered 2020-12-23: 1000 mg via ORAL
  Filled 2020-12-23: qty 2

## 2020-12-23 MED ORDER — HYDROMORPHONE HCL 1 MG/ML IJ SOLN
0.2500 mg | INTRAMUSCULAR | Status: DC | PRN
Start: 2020-12-23 — End: 2020-12-23
  Administered 2020-12-23 (×2): 0.5 mg via INTRAVENOUS

## 2020-12-23 MED ORDER — IRBESARTAN 150 MG PO TABS
150.0000 mg | ORAL_TABLET | Freq: Every day | ORAL | Status: DC
  Administered 2020-12-23 – 2020-12-24 (×2): 150 mg via ORAL
  Filled 2020-12-23 (×2): qty 1

## 2020-12-23 MED ORDER — ROSUVASTATIN CALCIUM 10 MG PO TABS: 10.0000 mg | ORAL_TABLET | ORAL | Status: DC

## 2020-12-23 MED ORDER — BUPIVACAINE LIPOSOME 1.3 % IJ SUSP
INTRAMUSCULAR | Status: DC | PRN
  Administered 2020-12-23: 20 mL

## 2020-12-23 MED ORDER — ORAL CARE MOUTH RINSE: 15.0000 mL | Freq: Once | OROMUCOSAL | Status: AC

## 2020-12-23 MED ORDER — BUPIVACAINE-EPINEPHRINE (PF) 0.25% -1:200000 IJ SOLN
INTRAMUSCULAR | Status: AC
  Filled 2020-12-23: qty 30

## 2020-12-23 MED ORDER — FENTANYL CITRATE (PF) 100 MCG/2ML IJ SOLN
INTRAMUSCULAR | Status: DC | PRN
  Administered 2020-12-23: 100 ug via INTRAVENOUS

## 2020-12-23 MED ORDER — ACETAMINOPHEN 325 MG PO TABS
650.0000 mg | ORAL_TABLET | Freq: Four times a day (QID) | ORAL | Status: DC
  Administered 2020-12-23 – 2020-12-24 (×5): 650 mg via ORAL
  Filled 2020-12-23 (×5): qty 2

## 2020-12-23 MED ORDER — CHLORHEXIDINE GLUCONATE 0.12 % MT SOLN
15.0000 mL | Freq: Once | OROMUCOSAL | Status: AC
  Administered 2020-12-23: 15 mL via OROMUCOSAL

## 2020-12-23 MED ORDER — OXYCODONE HCL 5 MG PO TABS: 5.0000 mg | ORAL_TABLET | ORAL | Status: DC | PRN

## 2020-12-23 MED ORDER — OXYCODONE HCL 5 MG PO TABS: 10.0000 mg | ORAL_TABLET | ORAL | Status: DC | PRN

## 2020-12-23 MED ORDER — 0.9 % SODIUM CHLORIDE (POUR BTL) OPTIME
TOPICAL | Status: DC | PRN
  Administered 2020-12-23: 1000 mL

## 2020-12-23 MED ORDER — FENTANYL CITRATE (PF) 100 MCG/2ML IJ SOLN
INTRAMUSCULAR | Status: AC
  Filled 2020-12-23: qty 2

## 2020-12-23 MED ORDER — SUGAMMADEX SODIUM 200 MG/2ML IV SOLN
INTRAVENOUS | Status: DC | PRN
  Administered 2020-12-23: 200 mg via INTRAVENOUS

## 2020-12-23 MED ORDER — LACTATED RINGERS IV SOLN
INTRAVENOUS | Status: DC
  Administered 2020-12-23: 1000 mL via INTRAVENOUS

## 2020-12-23 MED ORDER — CHLORHEXIDINE GLUCONATE CLOTH 2 % EX PADS: 6.0000 | MEDICATED_PAD | Freq: Once | CUTANEOUS | Status: DC

## 2020-12-23 MED ORDER — CEFAZOLIN SODIUM-DEXTROSE 2-4 GM/100ML-% IV SOLN
2.0000 g | INTRAVENOUS | Status: AC
  Administered 2020-12-23: 2 g via INTRAVENOUS
  Filled 2020-12-23: qty 100

## 2020-12-23 MED ORDER — OLMESARTAN-AMLODIPINE-HCTZ 20-5-12.5 MG PO TABS: 1.0000 | ORAL_TABLET | Freq: Every day | ORAL | Status: DC

## 2020-12-23 MED ORDER — ONDANSETRON 4 MG PO TBDP: 4.0000 mg | ORAL_TABLET | Freq: Four times a day (QID) | ORAL | Status: DC | PRN

## 2020-12-23 MED ORDER — LIDOCAINE 2% (20 MG/ML) 5 ML SYRINGE
INTRAMUSCULAR | Status: AC
  Filled 2020-12-23: qty 5

## 2020-12-23 MED ORDER — MENTHOL 3 MG MT LOZG
1.0000 | LOZENGE | OROMUCOSAL | Status: DC | PRN
  Filled 2020-12-23: qty 9

## 2020-12-23 MED ORDER — ONDANSETRON HCL 4 MG/2ML IJ SOLN: 4.0000 mg | Freq: Four times a day (QID) | INTRAMUSCULAR | Status: DC | PRN

## 2020-12-23 MED ORDER — SCOPOLAMINE 1 MG/3DAYS TD PT72
MEDICATED_PATCH | TRANSDERMAL | Status: AC
  Filled 2020-12-23: qty 1

## 2020-12-23 MED ORDER — ROCURONIUM BROMIDE 100 MG/10ML IV SOLN
INTRAVENOUS | Status: DC | PRN
  Administered 2020-12-23: 50 mg via INTRAVENOUS

## 2020-12-23 MED ORDER — ONDANSETRON HCL 4 MG/2ML IJ SOLN
INTRAMUSCULAR | Status: DC | PRN
  Administered 2020-12-23: 4 mg via INTRAVENOUS

## 2020-12-23 MED ORDER — PROPOFOL 10 MG/ML IV BOLUS
INTRAVENOUS | Status: DC | PRN
  Administered 2020-12-23: 150 mg via INTRAVENOUS

## 2020-12-23 MED ORDER — KETOROLAC TROMETHAMINE 30 MG/ML IJ SOLN
INTRAMUSCULAR | Status: DC | PRN
  Administered 2020-12-23: 30 mg via INTRAVENOUS

## 2020-12-23 MED ORDER — CELECOXIB 200 MG PO CAPS
400.0000 mg | ORAL_CAPSULE | ORAL | Status: AC
  Administered 2020-12-23: 400 mg via ORAL
  Filled 2020-12-23: qty 2

## 2020-12-23 MED ORDER — ONDANSETRON HCL 4 MG/2ML IJ SOLN
INTRAMUSCULAR | Status: AC
  Filled 2020-12-23: qty 2

## 2020-12-23 MED ORDER — DEXAMETHASONE SODIUM PHOSPHATE 10 MG/ML IJ SOLN
INTRAMUSCULAR | Status: AC
  Filled 2020-12-23: qty 1

## 2020-12-23 MED ORDER — AMISULPRIDE (ANTIEMETIC) 5 MG/2ML IV SOLN: 10.0000 mg | Freq: Once | INTRAVENOUS | Status: DC | PRN

## 2020-12-23 MED ORDER — DEXAMETHASONE SODIUM PHOSPHATE 10 MG/ML IJ SOLN
INTRAMUSCULAR | Status: DC | PRN
  Administered 2020-12-23: 10 mg via INTRAVENOUS

## 2020-12-23 MED ORDER — SCOPOLAMINE 1 MG/3DAYS TD PT72
1.0000 | MEDICATED_PATCH | TRANSDERMAL | Status: DC
  Administered 2020-12-23: 1.5 mg via TRANSDERMAL

## 2020-12-23 MED ORDER — HYDROMORPHONE HCL 1 MG/ML IJ SOLN
INTRAMUSCULAR | Status: AC
  Filled 2020-12-23: qty 1

## 2020-12-23 MED ORDER — KETOROLAC TROMETHAMINE 30 MG/ML IJ SOLN
INTRAMUSCULAR | Status: AC
  Filled 2020-12-23: qty 1

## 2020-12-23 MED ORDER — ROCURONIUM BROMIDE 10 MG/ML (PF) SYRINGE
PREFILLED_SYRINGE | INTRAVENOUS | Status: AC
  Filled 2020-12-23: qty 10

## 2020-12-23 SURGICAL SUPPLY — 31 items
ADH SKN CLS APL DERMABOND .7 (GAUZE/BANDAGES/DRESSINGS) ×1
APL PRP STRL LF DISP 70% ISPRP (MISCELLANEOUS) ×1
BINDER ABDOMINAL 12 ML 46-62 (SOFTGOODS) ×2 IMPLANT
CHLORAPREP W/TINT 26 (MISCELLANEOUS) ×2 IMPLANT
COVER SURGICAL LIGHT HANDLE (MISCELLANEOUS) ×2 IMPLANT
COVER WAND RF STERILE (DRAPES) ×2 IMPLANT
DERMABOND ADVANCED (GAUZE/BANDAGES/DRESSINGS) ×1
DERMABOND ADVANCED .7 DNX12 (GAUZE/BANDAGES/DRESSINGS) ×1 IMPLANT
DRAIN CHANNEL 19F RND (DRAIN) ×2 IMPLANT
DRAPE LAPAROSCOPIC ABDOMINAL (DRAPES) ×2 IMPLANT
ELECT BLADE TIP CTD 4 INCH (ELECTRODE) ×2 IMPLANT
ELECT PENCIL ROCKER SW 15FT (MISCELLANEOUS) ×2 IMPLANT
ELECT REM PT RETURN 15FT ADLT (MISCELLANEOUS) ×2 IMPLANT
EVACUATOR SILICONE 100CC (DRAIN) ×2 IMPLANT
GAUZE SPONGE 4X4 12PLY STRL (GAUZE/BANDAGES/DRESSINGS) ×2 IMPLANT
GLOVE SRG 8 PF TXTR STRL LF DI (GLOVE) ×1 IMPLANT
GLOVE SURG ENC MOIS LTX SZ7.5 (GLOVE) ×2 IMPLANT
GLOVE SURG UNDER POLY LF SZ8 (GLOVE) ×2
GOWN STRL REUS W/TWL XL LVL3 (GOWN DISPOSABLE) ×6 IMPLANT
KIT BASIN OR (CUSTOM PROCEDURE TRAY) ×2 IMPLANT
KIT TURNOVER KIT A (KITS) ×2 IMPLANT
MARKER SKIN DUAL TIP RULER LAB (MISCELLANEOUS) ×2 IMPLANT
MESH SOFT 12X12IN BARD (Mesh General) ×2 IMPLANT
PACK GENERAL/GYN (CUSTOM PROCEDURE TRAY) ×2 IMPLANT
SUT ETHILON 2 0 PS N (SUTURE) ×2 IMPLANT
SUT MNCRL AB 4-0 PS2 18 (SUTURE) ×2 IMPLANT
SUT PDS AB 2-0 CT2 27 (SUTURE) ×8 IMPLANT
SUT VIC AB 3-0 SH 8-18 (SUTURE) ×2 IMPLANT
TOWEL OR 17X26 10 PK STRL BLUE (TOWEL DISPOSABLE) ×2 IMPLANT
TOWEL OR NON WOVEN STRL DISP B (DISPOSABLE) ×2 IMPLANT
TRAY FOLEY MTR SLVR 16FR STAT (SET/KITS/TRAYS/PACK) IMPLANT

## 2020-12-23 NOTE — Anesthesia Postprocedure Evaluation (Signed)
Anesthesia Post Note  Patient: Donna Richmond  Procedure(s) Performed: INCISIONAL HERNIA REPAIR WITH MESH (N/A Abdomen)     Patient location during evaluation: PACU Anesthesia Type: General Level of consciousness: awake and alert and oriented Pain management: pain level controlled Vital Signs Assessment: post-procedure vital signs reviewed and stable Respiratory status: spontaneous breathing, nonlabored ventilation and respiratory function stable Cardiovascular status: blood pressure returned to baseline and stable Postop Assessment: no apparent nausea or vomiting Anesthetic complications: no   No complications documented.  Last Vitals:  Vitals:   12/23/20 1030 12/23/20 1045  BP: 133/64 (!) 125/52  Pulse: 69 68  Resp: 19 18  Temp:    SpO2: 98% 100%    Last Pain:  Vitals:   12/23/20 1030  TempSrc:   PainSc: 2                  Donna Richmond A.

## 2020-12-23 NOTE — Transfer of Care (Signed)
Immediate Anesthesia Transfer of Care Note  Patient: Donna Richmond  Procedure(s) Performed: INCISIONAL HERNIA REPAIR WITH MESH (N/A Abdomen)  Patient Location: PACU  Anesthesia Type:General  Level of Consciousness: awake, alert  and oriented  Airway & Oxygen Therapy: Patient Spontanous Breathing and Patient connected to face mask oxygen  Post-op Assessment: Report given to RN and Post -op Vital signs reviewed and stable  Post vital signs: Reviewed and stable  Last Vitals:  Vitals Value Taken Time  BP 129/56 12/23/20 0954  Temp    Pulse 78 12/23/20 0957  Resp 16 12/23/20 0957  SpO2 100 % 12/23/20 0957  Vitals shown include unvalidated device data.  Last Pain:  Vitals:   12/23/20 0542  TempSrc: Oral         Complications: No complications documented.

## 2020-12-23 NOTE — Op Note (Signed)
Patient: Donna Richmond ([birth year], [id number])  Date of Surgery: 12/23/2020   Preoperative Diagnosis: RECURRENT INCISIONAL HERNIA   Postoperative Diagnosis: RECURRENT INCISIONAL HERNIA   Surgical Procedure: RECURRENT INCISIONAL HERNIA REPAIR WITH MESH:    Operative Team Members:  Surgeon(s) and Role:    * Shelva Hetzer, Nickola Major, MD - Primary   Anesthesiologist: Josephine Igo, MD CRNA: Victoriano Lain, CRNA; [place] Stephanie C, CRNA   Anesthesia: General   Fluids: Chrystalloid  Complications: None  Drains:  19 Fr. Blake drain in the retromuscular position  Specimen: None  Disposition:  PACU - hemodynamically stable.  Plan of Care: Admit for overnight observation  Indications for Procedure: Donna Richmond is a 65 y.o. female who presented with a recurrent incisional hernia.  She had an ileocecectomy for hernia repair Crohn's disease in the past.  She developed a hernia at her incision.  Her colorectal surgeon repaired this simply with sutures due to concerns about placing mesh intra-abdominal in this patient with Crohn's disease.  The hernia has recurred.  Her Crohn's is under good control with Stelara.  She presents today for elective hernia repair.  The risks, benefits and alternatives of hernia repair were discussed with the patient who granted consent to proceed.  Findings:  Hernia Location: Ventral hernia location: Epigastric (M2) Hernia Size:  7 cm wide x 10 cm tall Mesh Size &Type:  23 cm tall x 13 cm wide Bard Soft Mesh Mesh Position: [place] - Retromuscular Myofascial Releases: Bilateral Myofascial Release: Posterior rectus myofascial release   Description of Procedure:  The patient was positioned supine on the operating room table, adequately padded and secured.  A timeout procedure was performed.  A midline incision was made and dissection was carried down through subcutaneous tissue. The prior scar was excised, completely. The abdomen was  entered safely.  There were minimal adhesions.  A meticulous and tedious sharp lysis of adhesions was carried out.  This was completed with no injury to the viscera.  After the anterior abdominal wall was cleared off of all adhesions, a large safety towel was placed over the viscera to protect them.  The hernia defect was located in the [place] region. The total hernia defect area was measured and the size was recorded above under findings.  A rectus myofascial release was performed on the LEFT side. The posterior rectus sheath was incised just lateral to the hernia edge.  This incision in the posterior rectus sheath was extended both cephalad and caudal to the hernia defect.  Dissection was carried out laterally in the retromuscular plane to the edge of the rectus sheath progressively disconnecting the rectus muscle from the underlying posterior rectus sheath. The segmental innervation comprised of intercostal nerve branches 8, 9, 10, 11 and 12 were individually identified and preserved.  The arterial blood supply to the rectus muscle comprised of the superior and inferior epigastric arteries along with the small terminal branches from the posterior intercostal arteries 10, 11 and 12, the subcostal artery, the posterior lumbar arteries, and the deep circumflex artery were all identified and individually preserved.  The rectus myofascial release accomplished medialization of the posterior rectus sheath towards the midline and disinsertion of the rectus muscle from its surrounding fascia, and thus its encasement in the rectus sheath, allowing for widening of the rectus muscle and transfer of the rectus flap towards the midline.  This will allow for future inset of the medial aspect of the flap for abdominal wall reconstruction.  A rectus  myofascial release was performed on the RIGHT side. The posterior rectus sheath was incised just lateral to the hernia edge.  This incision in the posterior  rectus sheath was extended both cephalad and caudal to the hernia defect.  Dissection was carried out laterally in the retromuscular plane to the edge of the rectus sheath progressively disconnecting the rectus muscle from the underlying posterior rectus sheath. The segmental innervation comprised of intercostal nerve branches 8, 9, 10, 11 and 12 were individually identified and preserved.  The arterial blood supply to the rectus muscle comprised of the superior and inferior epigastric arteries along with the small terminal branches from the posterior intercostal arteries 10, 11 and 12, the subcostal artery, the posterior lumbar arteries, and the deep circumflex artery were all identified and individually preserved.  The rectus myofascial release accomplished medialization of the posterior rectus sheath towards the midline and disinsertion of the rectus muscle from its surrounding fascia, and thus its encasement in the rectus sheath, allowing for widening of the rectus muscle and transfer of the rectus flap towards the midline.  This will allow for future inset of the medial aspect of the flap for abdominal wall reconstruction.  The towel was removed and the posterior fascia was reapproximated in the midline with a continuous 2-0 PDS suture. The retromuscular plane was copiously irrigated with warm normal saline. There was good hemostasis of the operative field.   A transversus abdominis plane (TAP) block was performed bilaterally with Exparel.  The anesthetic was injected into the plane between the transversus abdominis and internal abdominal oblique muscle bilaterally.  A new set of sterile gloves were used prior to handling the mesh. The mesh listed above was brought into the operative field and cut to the size specified above.  The mesh was deployed into the retromuscular position where it conformed well to the space.  It did not require any fixation.  The mesh space was irrigated with saline.  One 56  [place] Blake drain was placed in the retromuscular position. The rectus muscles were re approximated in the midline utilizing a continuous 2-0 PDS suture taking 5 mm bites of the fascia with 5 mm advancement. The fascia came together well. The subcutaneous tissue was irrigated with saline. Scarpa's fascia was reapproximated with interrupted 3-0 Vicryl suture.  The subcuticular tissue was reapproximated with buried, interrupted 2-0 Vicryl suture.  The skin was closed with a 4-0 Monocryl subcuticular suture and skin glue.  All sponge and needle counts were correct at the end of this case.   Louanna Raw, MD General, Bariatric, & Minimally Invasive Surgery [facility], [place]

## 2020-12-23 NOTE — Plan of Care (Signed)
  Problem: Clinical Measurements: Goal: Diagnostic test results will improve Outcome: Progressing   Problem: Clinical Measurements: Goal: Respiratory complications will improve Outcome: Progressing   Problem: Clinical Measurements: Goal: Cardiovascular complication will be avoided Outcome: Progressing   Problem: Pain Managment: Goal: General experience of comfort will improve Outcome: Progressing

## 2020-12-23 NOTE — H&P (Signed)
Admitting Physician: Nickola Major Deaunna Olarte  Service: General Surgery  CC: Incisional hernia  Subjective   HPI: Donna Richmond is an 65 y.o. female who is here for elective incisional hernia repair.   She had an ileocecectomy for hernia repair Crohn's disease in the past.  She developed a hernia at best.  Her colorectal surgeon repaired this simply with sutures due to concerns about placing mesh intra-abdominal in this Crohn's patient.  The hernia has recurred.  Her Crohn's is under good control with Stelara.  She presents today for elective hernia repair  Past Medical History:  Diagnosis Date  . Anal stricture   . Appendicolith   . B12 deficiency   . Crohn's colitis ([place])   . Diverticulosis   . Female cystocele   . Hypertension   . IDA (iron deficiency anemia)   . Proctitis   . Ulcerative ileocolitis ([place])     Past Surgical History:  Procedure Laterality Date  . APPENDECTOMY    . DILATION AND CURETTAGE OF UTERUS    . ILEOCECETOMY  01/06/2017   laparoscopic ileocecetomy with primary ileocolostomy  . INCISIONAL HERNIA REPAIR    . TUBAL LIGATION      Family History  Problem Relation Age of Onset  . Breast cancer Daughter 37  . Colon cancer Neg Hx     Social:  reports that she has never smoked. She has never used smokeless tobacco. She reports that she does not drink alcohol and does not use drugs.  Allergies:  Allergies  Allergen Reactions  . Biaxin [Clarithromycin] Hives    Medications: Current Outpatient Medications  Medication Instructions  . acetaminophen (TYLENOL) 650 mg, Oral, Every 6 hours PRN  . AMBULATORY NON FORMULARY MEDICATION Medication Name: Abdominal [place] as directed  . dicyclomine (BENTYL) 10 MG capsule Take 1 -2 capsule by mouth 3(times daily) as needed for abdomen cramping.  . loperamide (IMODIUM) 2 mg, Oral, 3 times daily PRN  . Olmesartan-Amlodipine-HCTZ 20-5-12.5 MG TABS 1 tablet, Oral, Daily  . ondansetron (ZOFRAN) 4 mg, Oral,  Every 6 hours PRN  . rosuvastatin (CRESTOR) 10 mg, Oral, 2 times weekly  . STELARA 90 MG/ML SOSY injection INJECT THE CONTENTS OF 1 SYRINGE UNDER THE SKIN EVERY 8 WEEKS.  . Vitamin B12 1,000 mcg, Oral, Daily  . Vitamin D3 1,000 Units, Oral, Daily  . Zoster Vaccine Adjuvanted [facility]) injection Inject 0.5 ml IM. Repeat 2-6 months after first shot.<BR>DX: Z23; K50.10    ROS - all of the below systems have been reviewed with the patient and positives are indicated with bold text General: chills, fever or night sweats Eyes: blurry vision or double vision ENT: epistaxis or sore throat Allergy/Immunology: itchy/watery eyes or nasal congestion Hematologic/Lymphatic: bleeding problems, blood clots or swollen lymph nodes Endocrine: temperature intolerance or unexpected weight changes Breast: new or changing breast lumps or nipple discharge Resp: cough, shortness of breath, or wheezing CV: chest pain or dyspnea on exertion GI: as per HPI GU: dysuria, trouble voiding, or hematuria MSK: joint pain or joint stiffness Neuro: TIA or stroke symptoms Derm: pruritus and skin lesion changes Psych: anxiety and depression  Objective   PE Blood pressure (!) 153/59, pulse 74, temperature 99.1 F (37.3 C), temperature source Oral, resp. rate 18, height 5' 2"  (1.575 m), weight 86.2 kg, SpO2 100 %. Constitutional: NAD; conversant; no deformities Eyes: Moist conjunctiva; no lid lag; anicteric; PERRL Neck: Trachea midline; no thyromegaly Lungs: Normal respiratory effort; no tactile fremitus CV: RRR; no palpable thrills;  no pitting edema GI: Abd incisional hernia mid abdomen; no palpable hepatosplenomegaly MSK: Normal range of motion of extremities; no clubbing/cyanosis Psychiatric: Appropriate affect; alert and oriented x3 Lymphatic: No palpable cervical or axillary lymphadenopathy  Results for orders placed or performed during the hospital encounter of 12/23/20 (from the past 24 hour(s))  Basic  metabolic panel per protocol     Status: Abnormal   Collection Time: 12/23/20  5:40 AM  Result Value Ref Range   Sodium 139 135 - 145 mmol/L   Potassium 3.0 (L) 3.5 - 5.1 mmol/L   Chloride 103 98 - 111 mmol/L   CO2 25 22 - 32 mmol/L   Glucose, Bld 103 (H) 70 - 99 mg/dL   BUN 17 8 - 23 mg/dL   Creatinine, Ser 0.86 0.44 - 1.00 mg/dL   Calcium 9.4 8.9 - 10.3 mg/dL   GFR, Estimated >60 >60 mL/min   Anion gap 11 5 - 15  CBC per protocol     Status: None   Collection Time: 12/23/20  5:40 AM  Result Value Ref Range   WBC 5.1 4.0 - 10.5 K/uL   RBC 4.78 3.87 - 5.11 MIL/uL   Hemoglobin 14.2 12.0 - 15.0 g/dL   HCT 42.6 36.0 - 46.0 %   MCV 89.1 80.0 - 100.0 fL   MCH 29.7 26.0 - 34.0 pg   MCHC 33.3 30.0 - 36.0 g/dL   RDW 12.8 11.5 - 15.5 %   Platelets 207 150 - 400 K/uL   nRBC 0.0 0.0 - 0.2 %    Imaging Orders  No imaging studies ordered today     Assessment and Plan   Donna Richmond is an 65 y.o. female with a recurrent incisional hernia.  I recommended open repair with mesh in the retromuscular space.  The procedure itself as well as its risk, benefits, and alternatives were discussed the patient granted consent to proceed.  We will proceed to the operating room as scheduled.  Felicie Morn, MD  [facility] Use [email or web address] to contact on call provider

## 2020-12-23 NOTE — Anesthesia Procedure Notes (Signed)
Procedure Name: Intubation Date/Time: 12/23/2020 7:37 AM Performed by: [place] Ayona Yniguez C, CRNA Pre-anesthesia Checklist: Patient identified, Emergency Drugs available, Suction available and Patient being monitored Patient Re-evaluated:Patient Re-evaluated prior to induction Oxygen Delivery Method: Circle system utilized Preoxygenation: Pre-oxygenation with 100% oxygen Induction Type: IV induction Ventilation: Mask ventilation without difficulty Laryngoscope Size: Mac and 3 Grade View: Grade I Tube type: Oral Tube size: 7.0 mm Number of attempts: 1 Airway Equipment and Method: Stylet and Oral airway Placement Confirmation: ETT inserted through vocal cords under direct vision,  positive ETCO2 and breath sounds checked- equal and bilateral Secured at: 22 cm Tube secured with: Tape Dental Injury: Teeth and Oropharynx as per pre-operative assessment

## 2020-12-24 ENCOUNTER — Encounter (HOSPITAL_COMMUNITY): Payer: Self-pay | Admitting: Surgery

## 2020-12-24 LAB — CBC
HCT: 32.5 % — ABNORMAL LOW (ref 36.0–46.0)
HCT: 33.4 % — ABNORMAL LOW (ref 36.0–46.0)
Hemoglobin: 10.7 g/dL — ABNORMAL LOW (ref 12.0–15.0)
Hemoglobin: 10.9 g/dL — ABNORMAL LOW (ref 12.0–15.0)
MCH: 29.8 pg (ref 26.0–34.0)
MCH: 29.7 pg (ref 26.0–34.0)
MCHC: 32.9 g/dL (ref 30.0–36.0)
MCHC: 32.6 g/dL (ref 30.0–36.0)
MCV: 90.5 fL (ref 80.0–100.0)
MCV: 91 fL (ref 80.0–100.0)
Platelets: 152 10*3/uL (ref 150–400)
Platelets: 161 10*3/uL (ref 150–400)
RBC: 3.59 MIL/uL — ABNORMAL LOW (ref 3.87–5.11)
RBC: 3.67 MIL/uL — ABNORMAL LOW (ref 3.87–5.11)
RDW: 13 % (ref 11.5–15.5)
RDW: 13.2 % (ref 11.5–15.5)
WBC: 9.2 10*3/uL (ref 4.0–10.5)
WBC: 8 10*3/uL (ref 4.0–10.5)
nRBC: 0 % (ref 0.0–0.2)
nRBC: 0 % (ref 0.0–0.2)

## 2020-12-24 LAB — BASIC METABOLIC PANEL
Anion gap: 8 (ref 5–15)
BUN: 20 mg/dL (ref 8–23)
CO2: 23 mmol/L (ref 22–32)
Calcium: 8.8 mg/dL — ABNORMAL LOW (ref 8.9–10.3)
Chloride: 104 mmol/L (ref 98–111)
Creatinine, Ser: 1.02 mg/dL — ABNORMAL HIGH (ref 0.44–1.00)
GFR, Estimated: >60 mL/min (ref >60)
Glucose, Bld: 131 mg/dL — ABNORMAL HIGH (ref 70–99)
Potassium: 3.6 mmol/L (ref 3.5–5.1)
Sodium: 135 mmol/L (ref 135–145)

## 2020-12-24 MED ORDER — ACETAMINOPHEN 500 MG PO TABS: 500.0000 mg | ORAL_TABLET | Freq: Four times a day (QID) | ORAL | 0 refills | Status: AC | PRN

## 2020-12-24 MED ORDER — IBUPROFEN 800 MG PO TABS: 800.0000 mg | ORAL_TABLET | Freq: Three times a day (TID) | ORAL | 0 refills | Status: AC

## 2020-12-24 MED ORDER — POTASSIUM CHLORIDE CRYS ER 20 MEQ PO TBCR
40.0000 meq | EXTENDED_RELEASE_TABLET | Freq: Once | ORAL | Status: AC
  Administered 2020-12-24: 40 meq via ORAL
  Filled 2020-12-24: qty 2

## 2020-12-24 MED ORDER — OXYCODONE-ACETAMINOPHEN 5-325 MG PO TABS: 1.0000 | ORAL_TABLET | ORAL | 0 refills | Status: DC | PRN

## 2020-12-24 NOTE — Progress Notes (Signed)
Pt ambulated in hall with no difficulty. Rn will continue to monitor.

## 2020-12-24 NOTE — Discharge Instructions (Signed)
VENTRAL HERNIA REPAIR POST OPERATIVE INSTRUCTIONS  Thinking Clearly   The anesthesia may cause you to feel different for 1 or 2 days. Do not drive, drink alcohol, or make any big decisions for at least 2 days.  Nutrition  When you wake up, you will be able to drink small amounts of liquid. If you do not feel sick, you can slowly advance your diet to regular foods.  Continue to drink lots of fluids, usually about 8 to 10 glasses per day.  Eat a high-fiber diet so you dont strain during bowel movements.  High-Fiber Foods o Foods high in fiber include beans, bran cereals and whole-grain breads, peas, dried fruit (figs, apricots, and dates), raspberries, blackberries, strawberries, sweet corn, broccoli, baked potatoes with skin, plums, pears, apples, greens, and nuts. Activity  Slowly increase your activity. Be sure to get up and walk every hour or so to prevent blood clots.  No heavy lifting or strenuous activity for 4 weeks following surgery to prevent hernias at your incision sites or recurrence of your hernia.  It is normal to feel tired. You may need more sleep than usual.  Get your rest but make sure to get up and move around frequently to prevent blood clots and pneumonia.  Work and Return to [employer] can go back to work when you feel well enough. Discuss the timing with your surgeon.  You can usually go back to school or work 1 week or less after an laparoscopic or an open repair.  If your work requires heavy lifting or strenuous activity you need to be placed on light duty for 4 weeks following surgery.  You can return to gym class, sports or other physical activities 4 weeks after surgery.  Wound Care  You may experience significant bruising throughout the abdominal wall that may track down into the groin including into the scrotum in males.  Rest, elevating the groin and scrotum above the level of the heart, ice and compression with tight fitting underwear or an  abdominal binder can help.   Always wash your hands before and after touching near your incision site.  Do not soak in a bathtub until cleared at your follow up appointment. You may take a shower 24 hours after surgery.  A small amount of drainage from the incision is normal. If the drainage is thick and yellow or the site is red, you may have an infection, so call your surgeon.  If you have a drain in one of your incisions, it will be taken out in office when the drainage stops.  Steri-Strips will fall off in 7 to 10 days or they will be removed during your first office visit.  If you have dermabond glue covering over the incision, allow the glue to flake off on its own.  Protect the new skin, especially from the sun. The sun can burn and cause darker scarring.  Your scar will heal in about 4 to 6 weeks and will become softer and continue to fade over the next year.  The cosmetic appearance of the incisions will improve over the course of the first year after surgery.  Sensation around your incision will return in a few weeks or months.  Bowel Movements  After intestinal surgery, you may have loose watery stools for several days. If watery diarrhea lasts longer than 3 days, contact your surgeon.  Pain medication (narcotics) can cause constipation. Increase the fiber in your diet with high-fiber foods if you are  constipated. You can take an over the counter stool softener like Colace to avoid constipation.  Additional over the counter medications can also be used if Colace isn't sufficient (for example, Milk of Magnesia or Miralax).  Pain  The amount of pain is different for each person. Some people need only 1 to 3 doses of pain control medication, while others need more.  Take alternating doses of tylenol and ibuprofen around the clock for the first five days following surgery.  This will provide a baseline of pain control and help with inflammation.  Take the narcotic pain medication  in addition if needed for severe pain.  Contact Your Surgeon at [phone], if you have:  Pain that will not go away  Pain that gets worse  A fever of more than 101F (38.3C)  Repeated vomiting  Swelling, redness, bleeding, or bad-smelling drainage from your wound site  Strong abdominal pain  No bowel movement or unable to pass gas for 3 days  Watery diarrhea lasting longer than 3 days  Pain Control  The goal of pain control is to minimize pain, keep you moving and help you heal. Your surgical team will work with you on your pain plan. Most often a combination of therapies and medications are used to control your pain. You may also be given medication (local anesthetic) at the surgical site. This may help control your pain for several days.  Extreme pain puts extra stress on your body at a time when your body needs to focus on healing. Do not wait until your pain has reached a level 10 or is unbearable before telling your doctor or nurse. It is much easier to control pain before it becomes severe.  Following a laparoscopic procedure, pain is sometimes felt in the shoulder. This is due to the gas inserted into your abdomen during the procedure. Moving and walking helps to decrease the gas and the right shoulder pain.   Use the guide below for ways to manage your post-operative pain. Learn more by going to facs.org/safepaincontrol.  How Intense Is My Pain Common Therapies to Feel Better       I hardly notice my pain, and it does not interfere with my activities.  I notice my pain and it distracts me, but I can still do activities (sitting up, walking, standing).  Non-Medication Therapies  Ice (in a bag, applied over clothing at the surgical site), elevation, rest, meditation, massage, distraction (music, TV, play) walking and mild exercise Splinting the abdomen with pillows +  Non-Opioid Medications Acetaminophen (Tylenol) Non-steroidal anti-inflammatory drugs  (NSAIDS) Aspirin, Ibuprofen (Motrin, Advil) Naproxen (Aleve) Take these as needed, when you feel pain. Both acetaminophen and NSAIDs help to decrease pain and swelling (inflammation).      My pain is hard to ignore and is more noticeable even when I rest.  My pain interferes with my usual activities.  Non-Medication Therapies  +  Non-Opioid medications  Take on a regular schedule (around-the-clock) instead of as needed. (For example, Tylenol every 6 hours at 9:00 am, 3:00 pm, 9:00 pm, 3:00 am and Motrin every 6 hours at 12:00 am, 6:00 am, 12:00 pm, 6:00 pm)         I am focused on my pain, and I am not doing my daily activities.  I am groaning in pain, and I cannot sleep. I am unable to do anything.  My pain is as bad as it could be, and nothing else matters.  Non-Medication Therapies  +  Around-the-Clock Non-Opioid Medications  +  Short-acting opioids  Opioids should be used with other medications to manage severe pain. Opioids block pain and give a feeling of euphoria (feel high). Addiction, a serious side effect of opioids, is rare with short-term (a few days) use.  Examples of short-acting opioids include: Tramadol (Ultram), Hydrocodone (Norco, Vicodin), Hydromorphone (Dilaudid), Oxycodone (Oxycontin)     The above directions have been adapted from the [employer] Surgical Patient Education Program.  Please refer to the ACS website if needed: [email or web address]   Louanna Raw, MD [facility], [place], [place], [place], [place]  [address] ?  [address] [place], [place]   [address] [phone] ? [phone] ? FAX [phone] Web site: [email or web address]

## 2020-12-24 NOTE — Progress Notes (Signed)
Patient was given discharge instructions, and all questions were answered.  Patient was stable for discharge and was taken to the main exit by wheelchair.

## 2020-12-24 NOTE — Progress Notes (Signed)
Check CBC at noon to ensure hgb stable.  If stable, plan for discharge home this afternoon.

## 2020-12-24 NOTE — Discharge Summary (Signed)
Patient ID: Donna Richmond [id number] 64 y.o. 1956-03-31  12/23/2020  Discharge date and time: 12/24/2020  Admitting Physician: [place]  Discharge Physician: [place]  Admission Diagnoses: Recurrent incisional hernia [K43.2] Patient Active Problem List   Diagnosis Date Noted  . Recurrent incisional hernia 12/23/2020  . Malnutrition of moderate degree 11/30/2016  . Crohn's ileitis, with fistula ([place]) 11/29/2016  . Exacerbation of Crohn's disease with fistula [facility])      Discharge Diagnoses: Recurrent Incisional Hernia Patient Active Problem List   Diagnosis Date Noted  . Recurrent incisional hernia 12/23/2020  . Malnutrition of moderate degree 11/30/2016  . Crohn's ileitis, with fistula ([place]) 11/29/2016  . Exacerbation of Crohn's disease with fistula ([place])     Operations: Procedure(s): INCISIONAL HERNIA REPAIR WITH MESH  Admission Condition: good  Discharged Condition: good  Indication for Admission: Recurrent incisional hernia  Hospital Course: Donna Richmond is a 65 year old female who presented with recurrent incisional hernia.  She underwent elective recurrent incisional hernia repair with mesh Conley Simmonds Stoppa repair) on 12/23/20 and was discharged the following day.  Consults: None  Significant Diagnostic Studies: None  Treatments: surgery: as above  Disposition: Home  Patient Instructions:  Allergies as of 12/24/2020      Reactions   Biaxin [clarithromycin] Hives      Medication List    TAKE these medications   acetaminophen 325 MG tablet Commonly known as: TYLENOL Take 650 mg by mouth every 6 (six) hours as needed for moderate pain. What changed: Another medication with the same name was added. Make sure you understand how and when to take each.   acetaminophen 500 MG tablet Commonly known as: TYLENOL Take 1 tablet (500 mg total) by mouth every 6 (six) hours as needed. What changed: You were already taking a medication with  the same name, and this prescription was added. Make sure you understand how and when to take each.   AMBULATORY NON FORMULARY MEDICATION Medication Name: Abdominal Binder- Fit as directed   dicyclomine 10 MG capsule Commonly known as: BENTYL Take 1 -2 capsule by mouth 3(times daily) as needed for abdomen cramping. What changed:   how much to take  how to take this  when to take this  reasons to take this  additional instructions   ibuprofen 800 MG tablet Commonly known as: ADVIL Take 1 tablet (800 mg total) by mouth every 8 (eight) hours for 7 days.   loperamide 2 MG capsule Commonly known as: IMODIUM Take 1 capsule (2 mg total) by mouth 3 (three) times daily as needed for diarrhea or loose stools.   Olmesartan-amLODIPine-HCTZ 20-5-12.5 MG Tabs Take 1 tablet by mouth daily.   ondansetron 4 MG tablet Commonly known as: ZOFRAN Take 1 tablet (4 mg total) by mouth every 6 (six) hours as needed for nausea or vomiting.   oxyCODONE-acetaminophen 5-325 MG tablet Commonly known as: Percocet Take 1 tablet by mouth every 4 (four) hours as needed for severe pain.   rosuvastatin 10 MG tablet Commonly known as: CRESTOR Take 10 mg by mouth 2 (two) times a week.   Shingrix injection Generic drug: Zoster Vaccine Adjuvanted Inject 0.5 ml IM. Repeat 2-6 months after first shot. DX: Z23; K50.10   Stelara 90 MG/ML Sosy injection Generic drug: ustekinumab INJECT THE CONTENTS OF 1 SYRINGE UNDER THE SKIN EVERY 8 WEEKS. What changed: See the new instructions.   Vitamin B12 1000 MCG Tbcr Take 1,000 mcg by mouth daily.   Vitamin D3 25  MCG (1000 UT) Caps Take 1,000 Units by mouth daily.       Activity: no heavy lifting for 4 weeks Diet: regular diet Wound Care: keep wound clean and dry  Follow-up:  With Dr. Thermon Leyland in 3 weeks.  Signed: Nickola Major Sharaine Del[facility] Minimally Invasive Surgery [facility], [place]   12/24/2020, 1:27 PM

## 2021-01-05 ENCOUNTER — Other Ambulatory Visit: Payer: Self-pay | Admitting: Internal Medicine

## 2021-01-12 ENCOUNTER — Telehealth: Payer: Self-pay | Admitting: Internal Medicine

## 2021-01-13 NOTE — Telephone Encounter (Signed)
Fax received from [facility] that Delsa Grana has been approved from 01/09/21 until further notice.

## 2021-01-19 ENCOUNTER — Telehealth: Payer: Self-pay | Admitting: Internal Medicine

## 2021-01-19 NOTE — Telephone Encounter (Signed)
Pt reports she cannot afford the stelara now that she has retired. Mailing pt the [employer] pt assistance application. She is due her next injection next week. Will try to get her a sample for that dose until we can see if she will be approved by [employer].

## 2021-01-19 NOTE — Telephone Encounter (Signed)
Pt is requesting a call back from a nurse to discuss her Stelara medication, pt did not disclose any further information.

## 2021-01-20 NOTE — Telephone Encounter (Signed)
[employer] MD form faxed. Drug rep contacted to try and get sample for pt.

## 2021-02-04 ENCOUNTER — Telehealth: Payer: Self-pay

## 2021-02-04 NOTE — Telephone Encounter (Signed)
Spoke with Donna Richmond and let her know I had checked with [employer] and they are sending her a letter to sign and fill out for an appeal regarding the Stelara. She knows to look for the letter and return it to the office so it can be faxed in for her.   Donna Richmond also wanted to know when she needs to follow-up with Dr. Hilarie Fredrickson. Please advise.

## 2021-02-04 NOTE — Telephone Encounter (Signed)
Scheduling notified and they will contact pt regarding appt.

## 2021-02-04 NOTE — Telephone Encounter (Signed)
Aug/Sept followup unless symptoms warrant sooner visit

## 2021-02-26 ENCOUNTER — Telehealth: Payer: Self-pay | Admitting: Internal Medicine

## 2021-02-26 NOTE — Telephone Encounter (Signed)
Returned patient's call and she states [employer] is sending [place] a form to fill-out about the delivery of her Stelera. She would like the option of the card, so she can get it at [facility] and not have to pick it up at our office. Please advise patient if this is possible

## 2021-02-26 NOTE — Telephone Encounter (Signed)
Pt would like to speak with you about Stelara, she stated to have received it and had some information for you.

## 2021-03-06 ENCOUNTER — Other Ambulatory Visit: Payer: Self-pay | Admitting: Internal Medicine

## 2021-03-10 NOTE — Telephone Encounter (Signed)
Spoke with pt and let her know that the drug has to come from [employer] and be delivered to the office. She will have to pick up the med from the office. Dose is due 03/27/21. Product request form will be faxed next week and pt aware.

## 2021-03-26 ENCOUNTER — Telehealth: Payer: Self-pay | Admitting: Internal Medicine

## 2021-03-26 NOTE — Telephone Encounter (Signed)
Inbound call from patient requesting a call from a nurse please in regards to her Stelara medication.

## 2021-03-26 NOTE — Telephone Encounter (Signed)
Pt called and wanted to know if her Delsa Grana is here, her injection is due tomorrow. Let pt know it is here and she can pick it up today or tomorrow.

## 2021-05-05 ENCOUNTER — Telehealth: Payer: Self-pay | Admitting: Internal Medicine

## 2021-05-05 NOTE — Telephone Encounter (Signed)
Patient calling requesting med refill for Stelara. Pt states she was told to order med  2 weeks in advance.  Pt is requesting a return phone call.. Plz advise thank you

## 2021-05-06 NOTE — Telephone Encounter (Signed)
Refill form faxed to [employer] assistance.  Requested that med arrive by 8/10.  I left a message for the patient that she will be contacted once the drug is in.

## 2021-05-20 ENCOUNTER — Ambulatory Visit
Admission: RE | Admit: 2021-05-20 | Discharge: 2021-05-20 | Disposition: A | Discharge status: Home / Self Care | Payer: Medicare Other | Source: Ambulatory Visit | Attending: Family Medicine | Admitting: Family Medicine

## 2021-05-20 ENCOUNTER — Other Ambulatory Visit: Payer: Self-pay

## 2021-05-20 DIAGNOSIS — Z13820 Encounter for screening for osteoporosis: Secondary | ICD-10-CM

## 2021-07-03 ENCOUNTER — Other Ambulatory Visit: Payer: Self-pay | Admitting: Obstetrics and Gynecology

## 2021-07-03 ENCOUNTER — Telehealth: Payer: Self-pay | Admitting: Internal Medicine

## 2021-07-03 DIAGNOSIS — Z1231 Encounter for screening mammogram for malignant neoplasm of breast: Secondary | ICD-10-CM

## 2021-07-03 NOTE — Telephone Encounter (Signed)
Spoke with pt and let her know her medication is here and ready for her to pick up.

## 2021-07-03 NOTE — Telephone Encounter (Signed)
Inbound call from pt requesting a call back from the nurse in regards to [place].

## 2021-08-24 ENCOUNTER — Telehealth: Payer: Self-pay | Admitting: Internal Medicine

## 2021-08-24 NOTE — Telephone Encounter (Signed)
Inbound call from Patient seeking advice about her medication. Please Advise.

## 2021-08-24 NOTE — Telephone Encounter (Signed)
Pt called and requested her Stelara be ordered. Let pt know it was ordered last week. Will call pt when the medications are delivered.

## 2021-09-14 ENCOUNTER — Ambulatory Visit
Admission: RE | Admit: 2021-09-14 | Discharge: 2021-09-14 | Disposition: A | Discharge status: Home / Self Care | Payer: Medicare Other | Source: Ambulatory Visit | Attending: Obstetrics and Gynecology | Admitting: Obstetrics and Gynecology

## 2021-09-14 DIAGNOSIS — Z1231 Encounter for screening mammogram for malignant neoplasm of breast: Secondary | ICD-10-CM

## 2021-10-12 DIAGNOSIS — E782 Mixed hyperlipidemia: Secondary | ICD-10-CM | POA: Diagnosis not present

## 2021-10-12 DIAGNOSIS — I129 Hypertensive chronic kidney disease with stage 1 through stage 4 chronic kidney disease, or unspecified chronic kidney disease: Secondary | ICD-10-CM | POA: Diagnosis not present

## 2021-10-12 DIAGNOSIS — Z131 Encounter for screening for diabetes mellitus: Secondary | ICD-10-CM | POA: Diagnosis not present

## 2021-10-19 DIAGNOSIS — K5090 Crohn's disease, unspecified, without complications: Secondary | ICD-10-CM | POA: Diagnosis not present

## 2021-10-19 DIAGNOSIS — M810 Age-related osteoporosis without current pathological fracture: Secondary | ICD-10-CM | POA: Diagnosis not present

## 2021-10-19 DIAGNOSIS — E782 Mixed hyperlipidemia: Secondary | ICD-10-CM | POA: Diagnosis not present

## 2021-10-19 DIAGNOSIS — I700 Atherosclerosis of aorta: Secondary | ICD-10-CM | POA: Diagnosis not present

## 2021-11-06 ENCOUNTER — Telehealth: Payer: Self-pay | Admitting: Internal Medicine

## 2021-11-06 NOTE — Telephone Encounter (Signed)
Patient called stating she needed to talk to you about her Stelara.  Please call.  Thank you.

## 2021-11-06 NOTE — Telephone Encounter (Signed)
Pt having issues getting her Stelara. Will try calling Monday to see if she has continued to be in the pt assistance program. Pt aware and knows I will follow-up with her on Monday.

## 2021-11-09 NOTE — Telephone Encounter (Signed)
Called [employer] got transferred to [place]. Found out pt has been enrolled was transferred to another number and no one ever answered. Was on hold for over 20 min. Have reached out to Stelara rep to see if she can help. Spoke with rep and she is going to look into this issue further and get back with [place] this afternoon. If not able to get the process to work she stated that she could bring a sample by tomorrow. Let pt know as soon as I hear back from rep I will get back with her.  Left message for pt that the samples have been signed for and should be shipped in 2-3 days. Will let her know when they arrive so she can pick them up. Drug rep looking into her [place] application status.

## 2021-11-23 NOTE — Telephone Encounter (Signed)
Patient is calling to follow up with you regarding message below.JR

## 2021-11-23 NOTE — Telephone Encounter (Signed)
Spoke with pt and let her know that we have 2 samples of stelara for her to pickup. Pt states she will come tomorrow to pick them up. Pt will call our office back if after the second injection she has not heard from [place].

## 2021-12-02 ENCOUNTER — Other Ambulatory Visit: Payer: Self-pay

## 2021-12-02 DIAGNOSIS — K5000 Crohn's disease of small intestine without complications: Secondary | ICD-10-CM

## 2021-12-02 MED ORDER — METRONIDAZOLE 250 MG PO TABS: 250.0000 mg | ORAL_TABLET | Freq: Three times a day (TID) | ORAL | 0 refills | Status: DC

## 2021-12-02 MED ORDER — CIPROFLOXACIN HCL 500 MG PO TABS: 500.0000 mg | ORAL_TABLET | Freq: Two times a day (BID) | ORAL | 0 refills | Status: DC

## 2021-12-02 NOTE — Telephone Encounter (Signed)
How soon do you want her seen? ?

## 2021-12-02 NOTE — Telephone Encounter (Signed)
Cipro 500 mg BID x 7 days, metronidazole 250 mg TID x 7 days ?Office visit  ?Continue Stelara ?Please document her last Stelara dose ?When she comes in will need CBC, CMP, quant gold, CRP, consideration of Stelara level ?Thanks ? ?

## 2021-12-02 NOTE — Telephone Encounter (Signed)
Per Dr. Hilarie Fredrickson pt to be seen in 2-3 weeks. Pt scheduled to see Nicoletta Ba PA 12/17/21 at 1:30pm. Antibiotics sent to pharmacy and pt aware. Last Stelara dose was 11/25/21. Lab orders in for OV. ?

## 2021-12-02 NOTE — Telephone Encounter (Signed)
Patient called requesting to speak with you regarding the medication below. ?

## 2021-12-02 NOTE — Telephone Encounter (Signed)
Pt was a little late getting her stelara injection due to change in pt assistance program. She called and states that she has "hard places on there bottom again." She states Dr. Hilarie Fredrickson has seen her with these in the past. She also reports her R labia is swollen and feels hard. Reports it hurts a little bit but mostly burns. She thinks she is having a flare. Please advise. ?

## 2021-12-10 ENCOUNTER — Other Ambulatory Visit (INDEPENDENT_AMBULATORY_CARE_PROVIDER_SITE_OTHER): Payer: Medicare Other

## 2021-12-10 ENCOUNTER — Other Ambulatory Visit: Payer: Self-pay

## 2021-12-10 DIAGNOSIS — K5000 Crohn's disease of small intestine without complications: Secondary | ICD-10-CM

## 2021-12-10 LAB — CBC WITH DIFFERENTIAL/PLATELET
Basophils Absolute: 0 10*3/uL (ref 0.0–0.1)
Basophils Relative: 0.6 % (ref 0.0–3.0)
Eosinophils Absolute: 0.1 10*3/uL (ref 0.0–0.7)
Eosinophils Relative: 2.1 % (ref 0.0–5.0)
HCT: 40.2 % (ref 36.0–46.0)
Hemoglobin: 13.5 g/dL (ref 12.0–15.0)
Lymphocytes Relative: 21.9 % (ref 12.0–46.0)
Lymphs Abs: 1.2 10*3/uL (ref 0.7–4.0)
MCHC: 33.6 g/dL (ref 30.0–36.0)
MCV: 87.5 fl (ref 78.0–100.0)
Monocytes Absolute: 0.5 10*3/uL (ref 0.1–1.0)
Monocytes Relative: 8.6 % (ref 3.0–12.0)
Neutro Abs: 3.7 10*3/uL (ref 1.4–7.7)
Neutrophils Relative %: 66.8 % (ref 43.0–77.0)
Platelets: 214 10*3/uL (ref 150.0–400.0)
RBC: 4.6 Mil/uL (ref 3.87–5.11)
RDW: 13.5 % (ref 11.5–15.5)
WBC: 5.6 10*3/uL (ref 4.0–10.5)

## 2021-12-10 LAB — C-REACTIVE PROTEIN: CRP: <1 mg/dL (ref 0.5–20.0)

## 2021-12-10 LAB — COMPREHENSIVE METABOLIC PANEL
ALT: 11 U/L (ref 0–35)
AST: 16 U/L (ref 0–37)
Albumin: 4.3 g/dL (ref 3.5–5.2)
Alkaline Phosphatase: 63 U/L (ref 39–117)
BUN: 16 mg/dL (ref 6–23)
CO2: 28 mEq/L (ref 19–32)
Calcium: 9.5 mg/dL (ref 8.4–10.5)
Chloride: 104 mEq/L (ref 96–112)
Creatinine, Ser: 0.78 mg/dL (ref 0.40–1.20)
GFR: 79.45 mL/min (ref >60.00)
Glucose, Bld: 82 mg/dL (ref 70–99)
Potassium: 3.6 mEq/L (ref 3.5–5.1)
Sodium: 139 mEq/L (ref 135–145)
Total Bilirubin: 0.5 mg/dL (ref 0.2–1.2)
Total Protein: 6.7 g/dL (ref 6.0–8.3)

## 2021-12-10 MED ORDER — AMOXICILLIN-POT CLAVULANATE 875-125 MG PO TABS: 1.0000 | ORAL_TABLET | Freq: Two times a day (BID) | ORAL | 0 refills | Status: DC

## 2021-12-10 MED ORDER — TRAMADOL HCL 50 MG PO TABS: 50.0000 mg | ORAL_TABLET | Freq: Four times a day (QID) | ORAL | 0 refills | Status: AC | PRN

## 2021-12-10 NOTE — Telephone Encounter (Signed)
Vaughan Basta  ?Pt needs MR pelvis with contrast ASAP -- perianal Crohn's, rule out abscess ?CBC, CMP, CRP ?Augmentin 875 mg BID x 14 days ?Tramadol 50 mg every 6 hours PRN pain ?MiraLax if needed to keep stools soft ? ?

## 2021-12-11 ENCOUNTER — Telehealth: Payer: Self-pay | Admitting: Internal Medicine

## 2021-12-11 ENCOUNTER — Other Ambulatory Visit: Payer: Self-pay

## 2021-12-11 MED ORDER — AMOXICILLIN-POT CLAVULANATE 875-125 MG PO TABS: 1.0000 | ORAL_TABLET | Freq: Two times a day (BID) | ORAL | 0 refills | Status: DC

## 2021-12-11 NOTE — Telephone Encounter (Signed)
Looks like this was already taken care of by Dollene Cleveland, RN this morning. ?

## 2021-12-11 NOTE — Telephone Encounter (Signed)
Patient called stating that her medication was sent to the wrong pharmacy.  [facility] no [facility] uses the [facility] and her prescription needs to be sent to [facility] on [address] in [place].   ? ?Thank you. ?

## 2021-12-13 ENCOUNTER — Ambulatory Visit (HOSPITAL_COMMUNITY)
Admission: RE | Admit: 2021-12-13 | Discharge: 2021-12-13 | Disposition: A | Discharge status: Home / Self Care | Payer: Medicare Other | Source: Ambulatory Visit | Attending: Internal Medicine | Admitting: Internal Medicine

## 2021-12-13 DIAGNOSIS — K5000 Crohn's disease of small intestine without complications: Secondary | ICD-10-CM | POA: Insufficient documentation

## 2021-12-13 MED ORDER — GADOBUTROL 1 MMOL/ML IV SOLN
8.0000 mL | Freq: Once | INTRAVENOUS | Status: AC | PRN
  Administered 2021-12-13: 8 mL via INTRAVENOUS

## 2021-12-15 ENCOUNTER — Ambulatory Visit (HOSPITAL_COMMUNITY)
Admission: RE | Admit: 2021-12-15 | Discharge: 2021-12-15 | Disposition: A | Discharge status: Home / Self Care | Payer: Medicare Other | Source: Ambulatory Visit | Attending: Internal Medicine | Admitting: Internal Medicine

## 2021-12-15 DIAGNOSIS — K5000 Crohn's disease of small intestine without complications: Secondary | ICD-10-CM | POA: Insufficient documentation

## 2021-12-15 DIAGNOSIS — K5090 Crohn's disease, unspecified, without complications: Secondary | ICD-10-CM | POA: Diagnosis not present

## 2021-12-15 DIAGNOSIS — K603 Anal fistula: Secondary | ICD-10-CM | POA: Diagnosis not present

## 2021-12-15 DIAGNOSIS — K632 Fistula of intestine: Secondary | ICD-10-CM | POA: Diagnosis not present

## 2021-12-15 MED ORDER — GADOBUTROL 1 MMOL/ML IV SOLN
9.0000 mL | Freq: Once | INTRAVENOUS | Status: AC | PRN
  Administered 2021-12-15: 9 mL via INTRAVENOUS

## 2021-12-16 LAB — QUANTIFERON-TB GOLD PLUS
Mitogen-NIL: >10 IU/mL
NIL: 0.02 IU/mL
QuantiFERON-TB Gold Plus: NEGATIVE
TB1-NIL: 0 IU/mL
TB2-NIL: 0 IU/mL

## 2021-12-17 ENCOUNTER — Other Ambulatory Visit (INDEPENDENT_AMBULATORY_CARE_PROVIDER_SITE_OTHER): Payer: Medicare Other

## 2021-12-17 ENCOUNTER — Encounter: Payer: Self-pay | Admitting: Physician Assistant

## 2021-12-17 ENCOUNTER — Ambulatory Visit: Payer: Medicare Other | Admitting: Physician Assistant

## 2021-12-17 VITALS — BP 142/70 | HR 60 | Ht 62.75 in | Wt 186.1 lb

## 2021-12-17 DIAGNOSIS — K5000 Crohn's disease of small intestine without complications: Secondary | ICD-10-CM | POA: Diagnosis not present

## 2021-12-17 DIAGNOSIS — K5010 Crohn's disease of large intestine without complications: Secondary | ICD-10-CM

## 2021-12-17 LAB — COMPREHENSIVE METABOLIC PANEL
ALT: 16 U/L (ref 0–35)
AST: 19 U/L (ref 0–37)
Albumin: 4.4 g/dL (ref 3.5–5.2)
Alkaline Phosphatase: 66 U/L (ref 39–117)
BUN: 16 mg/dL (ref 6–23)
CO2: 29 mEq/L (ref 19–32)
Calcium: 9.7 mg/dL (ref 8.4–10.5)
Chloride: 101 mEq/L (ref 96–112)
Creatinine, Ser: 0.89 mg/dL (ref 0.40–1.20)
GFR: 67.81 mL/min (ref >60.00)
Glucose, Bld: 95 mg/dL (ref 70–99)
Potassium: 3.5 mEq/L (ref 3.5–5.1)
Sodium: 138 mEq/L (ref 135–145)
Total Bilirubin: 0.6 mg/dL (ref 0.2–1.2)
Total Protein: 7.2 g/dL (ref 6.0–8.3)

## 2021-12-17 LAB — CBC WITH DIFFERENTIAL/PLATELET
Basophils Absolute: 0 10*3/uL (ref 0.0–0.1)
Basophils Relative: 0.6 % (ref 0.0–3.0)
Eosinophils Absolute: 0.1 10*3/uL (ref 0.0–0.7)
Eosinophils Relative: 2 % (ref 0.0–5.0)
HCT: 38.7 % (ref 36.0–46.0)
Hemoglobin: 13.2 g/dL (ref 12.0–15.0)
Lymphocytes Relative: 25.9 % (ref 12.0–46.0)
Lymphs Abs: 1.6 10*3/uL (ref 0.7–4.0)
MCHC: 34 g/dL (ref 30.0–36.0)
MCV: 86.9 fl (ref 78.0–100.0)
Monocytes Absolute: 0.5 10*3/uL (ref 0.1–1.0)
Monocytes Relative: 7.9 % (ref 3.0–12.0)
Neutro Abs: 3.8 10*3/uL (ref 1.4–7.7)
Neutrophils Relative %: 63.6 % (ref 43.0–77.0)
Platelets: 207 10*3/uL (ref 150.0–400.0)
RBC: 4.46 Mil/uL (ref 3.87–5.11)
RDW: 13.5 % (ref 11.5–15.5)
WBC: 6 10*3/uL (ref 4.0–10.5)

## 2021-12-17 LAB — C-REACTIVE PROTEIN: CRP: <1 mg/dL (ref 0.5–20.0)

## 2021-12-17 MED ORDER — SUTAB 1479-225-188 MG PO TABS: 1.0000 | ORAL_TABLET | ORAL | 0 refills | Status: DC

## 2021-12-17 MED ORDER — PREDNISONE 10 MG PO TABS: ORAL_TABLET | ORAL | 0 refills | Status: AC

## 2021-12-17 MED ORDER — FLUCONAZOLE 100 MG PO TABS: ORAL_TABLET | ORAL | 0 refills | Status: DC

## 2021-12-17 MED ORDER — METRONIDAZOLE 500 MG PO TABS: 500.0000 mg | ORAL_TABLET | Freq: Two times a day (BID) | ORAL | 0 refills | Status: AC

## 2021-12-17 NOTE — Progress Notes (Signed)
? ?Subjective:  ? ? Patient ID: Donna Richmond, female    DOB: 07/13/1956, 66 y.o.   MRN: [id number] ? ?HPI ?Donna Richmond is a pleasant 66 year old white female, known to Dr. Hilarie Fredrickson, with history of Crohn's ileitis and perianal Crohn's.  She is status post ileocecectomy in 2018. ?She underwent umbilical hernia repair with mesh in March 2022. ?She was last seen in the office about 1 year ago.  She has been managed most recently with Stelara which has been very effective for her, and did not have any evidence of active Crohn's at the time of last colonoscopy done in July 2021. ?Colonoscopy at that time showed an anal stricture, no visible fistula, end-to-end ilio colonic anastomosis neoterminal ileum within normal limits, normal-appearing colonic mucosa, few diverticuli and one 7 mm polyp was removed which was a sessile serrated adenoma.  There was noted to be a mucosal rent at the anal stricture.  Biopsies from the right and left colon showed no active inflammation. ?Patient has been taking the Stelara very regularly.  She did have a delay with her last injection of about 2 weeks as she had to wait for the medication.  Her last injection was on 11/25/2021. ?She had called here on 11/06/2021 stating that she had developed some firm areas "on her bottom, and right labial area.  She was started on a course of Cipro and Flagyl x7 days.  She called back on 12/10/2021 stating she was still having the same symptoms without any improvement and is currently taking a course of Augmentin and was given a prescription for tramadol which she has not used. ?She had MRI of the pelvis done 12/15/2021-which redemonstrates a linear intersphincteric fistula at 6 o'clock position which inserts on the left side of the gluteal cleft, similar appearance to prior exam May 2017 no associated fluid collection, unremarkable visualized pelvic bowel loops. ?Labs 12/10/2021 with normal CBC, CRP less than 1, c-Met within normal limits ? ?Today she says she  is still swollen and uncomfortable from the right labial irritation.  She has developed some diarrhea while being on the antibiotics, usually is having loose stool after eating, has not noticed any blood.  She denies any abdominal pain or cramping, no fever.  She has had some occasional intermittent chills. ?She says she is having some vaginal irritation but has not noted any drainage and has not had any drainage from the rectum or the labial area.  She says she is uncomfortable with sitting. ? ?Review of Systems Pertinent positive and negative review of systems were noted in the above HPI section.  All other review of systems was otherwise negative.  ? ?Outpatient Encounter Medications as of 12/17/2021  ?Medication Sig  ? acetaminophen (TYLENOL) 500 MG tablet Take 1 tablet (500 mg total) by mouth every 6 (six) hours as needed.  ? amoxicillin-clavulanate (AUGMENTIN) 875-125 MG tablet Take 1 tablet by mouth 2 (two) times daily.  ? Cholecalciferol (VITAMIN D3) 25 MCG (1000 UT) CAPS Take 1,000 Units by mouth daily.  ? Cyanocobalamin (VITAMIN B12) 1000 MCG TBCR Take 1,000 mcg by mouth daily.  ? dicyclomine (BENTYL) 10 MG capsule TAKE 1 -2 CAPSULE BY MOUTH 3(TIMES DAILY) AS NEEDED FOR ABDOMEN CRAMPING.  ? fluconazole (DIFLUCAN) 100 MG tablet Take 1 tablet on day 1 then repeat 5 days later  ? loperamide (IMODIUM) 2 MG capsule Take 1 capsule (2 mg total) by mouth 3 (three) times daily as needed for diarrhea or loose stools.  ? metroNIDAZOLE (FLAGYL) 500  MG tablet Take 1 tablet (500 mg total) by mouth 2 (two) times daily.  ? Olmesartan-Amlodipine-HCTZ 20-5-12.5 MG TABS Take 1 tablet by mouth daily.  ? ondansetron (ZOFRAN) 4 MG tablet Take 1 tablet (4 mg total) by mouth every 6 (six) hours as needed for nausea or vomiting.  ? predniSONE (DELTASONE) 10 MG tablet Take 2 tablets (20 mg total) by mouth daily with breakfast for 14 days, THEN 1.5 tablets (15 mg total) daily with breakfast for 14 days, THEN 1 tablet (10 mg total)  daily with breakfast for 14 days.  ? Sodium Sulfate-Mag Sulfate-KCl (SUTAB) [phone] MG TABS Take 1 kit by mouth as directed.  ? STELARA 90 MG/ML SOSY injection INJECT THE CONTENTS OF 1 SYRINGE UNDER THE SKIN EVERY 8 WEEKS.  ? traMADol (ULTRAM) 50 MG tablet Take 1 tablet (50 mg total) by mouth every 6 (six) hours as needed. (Patient not taking: Reported on 12/17/2021)  ? [DISCONTINUED] acetaminophen (TYLENOL) 325 MG tablet Take 650 mg by mouth every 6 (six) hours as needed for moderate pain.  ? [DISCONTINUED] AMBULATORY NON FORMULARY MEDICATION Medication Name: Abdominal Cobre as directed  ? [DISCONTINUED] ciprofloxacin (CIPRO) 500 MG tablet Take 1 tablet (500 mg total) by mouth 2 (two) times daily.  ? [DISCONTINUED] metroNIDAZOLE (FLAGYL) 250 MG tablet Take 1 tablet (250 mg total) by mouth 3 (three) times daily.  ? [DISCONTINUED] oxyCODONE-acetaminophen (PERCOCET) 5-325 MG tablet Take 1 tablet by mouth every 4 (four) hours as needed for severe pain.  ? [DISCONTINUED] rosuvastatin (CRESTOR) 10 MG tablet Take 10 mg by mouth 2 (two) times a week.  ? [DISCONTINUED] Zoster Vaccine Adjuvanted [facility]) injection Inject 0.5 ml IM. Repeat 2-6 months after first shot. ?DX: P53; K50.10  ? ?No facility-administered encounter medications on file as of 12/17/2021.  ? ?Allergies  ?Allergen Reactions  ? Biaxin [Clarithromycin] Hives  ? ?Patient Active Problem List  ? Diagnosis Date Noted  ? Recurrent incisional hernia 12/23/2020  ? Malnutrition of moderate degree 11/30/2016  ? Crohn's ileitis, with fistula ([place]) 11/29/2016  ? Exacerbation of Crohn's disease with fistula ([place])   ? ?Social History  ? ?Socioeconomic History  ? Marital status: Married  ?  Spouse name: Alda Lea  ? Number of children: 2  ? Years of education: Not on file  ? Highest education level: Not on file  ?Occupational History  ? Occupation: Software engineer   ?Tobacco Use  ? Smoking status: Never  ? Smokeless tobacco: Never  ?Vaping Use  ? Vaping Use:  Never used  ?Substance and Sexual Activity  ? Alcohol use: No  ?  Alcohol/week: 0.0 standard drinks  ? Drug use: No  ? Sexual activity: Yes  ?  Birth control/protection: Post-menopausal  ?Other Topics Concern  ? Not on file  ?Social History Narrative  ? Not on file  ? ?Social Determinants of Health  ? ?Financial Resource Strain: Not on file  ?Food Insecurity: Not on file  ?Transportation Needs: Not on file  ?Physical Activity: Not on file  ?Stress: Not on file  ?Social Connections: Not on file  ?Intimate Partner Violence: Not on file  ? ? ?Ms. Hershman's family history includes Breast cancer (age of onset: 32) in her daughter; Hypertension in her mother; Osteoporosis in her mother. ? ? ?   ?Objective:  ?  ?Vitals:  ? 12/17/21 1331  ?BP: (!) 142/70  ?Pulse: 60  ? ? ?Physical Exam Well-developed well-nourished WF in no acute distress.  Height, [id number]  BMI33 ? ?HEENT; nontraumatic  normocephalic, EOMI, PE R LA, sclera anicteric. ?Oropharynx; not examined today ?Neck; supple, no JVD ?Cardiovascular; regular rate and rhythm with S1-S2, no murmur rub or gallop ?Pulmonary; Clear bilaterally ?Abdomen; soft, some mild tenderness in the right lower quadrant no guarding or rebound, nondistended, no palpable mass or hepatosplenomegaly, bowel sounds are active ?Rectal; digital exam incomplete and difficult due to stenosis, unable to palpate any abnormality of the sphincter, on external exam, no external fistula visible, she does have some perianal erythema, patchy extending anteriorly to the labia, the posterior right labia is somewhat edematous, erythematous and firm feeling, there is no fluctuance, no visible fistula, tender, and skin is dry ?Skin; benign exam, no jaundice rash or appreciable lesions ?Extremities; no clubbing cyanosis or edema skin warm and dry ?Neuro/Psych; alert and oriented x4, grossly nonfocal mood and affect appropriate  ? ? ? ?   ?Assessment & Plan:  ? ?#53 66 year old white female with history of  perianal Crohn's and Crohn's ileitis status post ileocecectomy 2018. ?Has been on Stelara over the past few years and doing very well, without any evidence of active disease as of colonoscopy July 2021, other th

## 2021-12-17 NOTE — Patient Instructions (Addendum)
If you are age 66 or older, your body mass index should be between 23-30. Your Body mass index is 33.23 kg/m?Marland Kitchen If this is out of the aforementioned range listed, please consider follow up with your Primary Care Provider. ?________________________________________________________ ? ?The [facility] providers would like to encourage you to use [facility] to communicate with providers for non-urgent requests or questions.  Due to long hold times on the telephone, sending your provider a message by [facility] may be a faster and more efficient way to get a response.  Please allow 48 business hours for a response.  Please remember that this is for non-urgent requests.  ?_______________________________________________________ ? ?You have been scheduled for a colonoscopy. Please follow written instructions given to you at your visit today.  ?Please pick up your prep supplies at the pharmacy within the next 1-3 days. ?If you use inhalers (even only as needed), please bring them with you on the day of your procedure. ? ?START Flagyl 400 mg 1 tablet twice daily for 4 weeks ?START Diflucan 100 mg 1 tablet today, then take 1 more tablet in 5 days. ?START Prednisone 10 mg 2 tablets in the morning for 2 weeks, the 1 1/2 tablets in the morning for 2 weeks, then 10 mg in the morning for 2 weeks.  ? ?We will be referring you to see Dr. Drue Flirt. Please expect a phone call from her office in the next few days. ? ?Follow up pending the results of your Colonoscopy. ? ?Thank you for entrusting me with your care and choosing [facility]. ?  ?Nicoletta Ba, PA-C ?

## 2021-12-21 ENCOUNTER — Telehealth: Payer: Self-pay | Admitting: Physician Assistant

## 2021-12-21 NOTE — Telephone Encounter (Signed)
Returned call to [employer]. He as advised me that Dr. Adriana Reams will not have a [facility] clinic until June, and is inquiring if the patient can be seen in [place]. I have that would be fine. He will contact the patient to schedule her an appointment. ?

## 2021-12-21 NOTE — Telephone Encounter (Signed)
Randall Hiss called from [facility] in regards to a referral that was sent, asking for a returned call to discuss referral. Please advise.   ?

## 2021-12-21 NOTE — Progress Notes (Signed)
Addendum: ?Reviewed and agree with assessment and management plan. ?Agree with repeat visit with Dr. Drue Flirt with colorectal surgery. ?We will see how she responds to longer course of abx and addition of short course of antifungal ?Steroid taper and then followup with me ?Chizaram Latino, Lajuan Lines, MD ? ?

## 2021-12-24 ENCOUNTER — Telehealth: Payer: Self-pay

## 2021-12-24 NOTE — Telephone Encounter (Signed)
Left message for pt to call back. Trying to find out if she has heard anything from [place] regarding pt assistance for her Stelara. ? ?Pt has been approved and has gotten a dose from the program. Pt will be due for drug level and ab level on 4/18, her next dose is due 4/19. Pt aware. ?

## 2022-01-12 ENCOUNTER — Encounter: Payer: Self-pay | Admitting: Internal Medicine

## 2022-01-12 DIAGNOSIS — H52223 Regular astigmatism, bilateral: Secondary | ICD-10-CM | POA: Diagnosis not present

## 2022-01-19 ENCOUNTER — Other Ambulatory Visit: Payer: Medicare Other

## 2022-01-19 NOTE — Addendum Note (Signed)
Addended by: Yevette Edwards on: 01/19/2022 02:33 PM ? ? Modules accepted: Orders ? ?

## 2022-01-21 ENCOUNTER — Ambulatory Visit (AMBULATORY_SURGERY_CENTER): Payer: Medicare Other | Admitting: Internal Medicine

## 2022-01-21 ENCOUNTER — Encounter: Payer: Self-pay | Admitting: Internal Medicine

## 2022-01-21 VITALS — BP 125/56 | HR 61 | Temp 96.0°F | Resp 16 | Ht 62.0 in | Wt 186.0 lb

## 2022-01-21 DIAGNOSIS — K5730 Diverticulosis of large intestine without perforation or abscess without bleeding: Secondary | ICD-10-CM | POA: Diagnosis not present

## 2022-01-21 DIAGNOSIS — K626 Ulcer of anus and rectum: Secondary | ICD-10-CM | POA: Diagnosis not present

## 2022-01-21 DIAGNOSIS — K5010 Crohn's disease of large intestine without complications: Secondary | ICD-10-CM | POA: Diagnosis not present

## 2022-01-21 DIAGNOSIS — K633 Ulcer of intestine: Secondary | ICD-10-CM

## 2022-01-21 DIAGNOSIS — K644 Residual hemorrhoidal skin tags: Secondary | ICD-10-CM | POA: Diagnosis not present

## 2022-01-21 MED ORDER — SODIUM CHLORIDE 0.9 % IV SOLN
500.0000 mL | Freq: Once | INTRAVENOUS | Status: DC
Start: 2022-01-21 — End: 2022-01-21

## 2022-01-21 NOTE — Progress Notes (Signed)
Sedate, gd SR, tolerated procedure well, VSS, report to RN 

## 2022-01-21 NOTE — Progress Notes (Signed)
? ?GASTROENTEROLOGY PROCEDURE H&P NOTE  ? ?Primary Care Physician: ?Marco Collie, MD ? ? ? ?Reason for Procedure:  History of ileal perianal Crohn's disease with recent flare ? ?Plan:    Colonoscopy ? ?Patient is appropriate for endoscopic procedure(s) in the ambulatory ([place]) setting. ? ?The nature of the procedure, as well as the risks, benefits, and alternatives were carefully and thoroughly reviewed with the patient. Ample time for discussion and questions allowed. The patient understood, was satisfied, and agreed to proceed.  ? ? ? ?HPI: ?Donna Richmond is a 66 y.o. female who presents for colonoscopy.  Medical history as below.  Tolerated the prep.  No recent chest pain or shortness of breath.  No abdominal pain today. ? ?Past Medical History:  ?Diagnosis Date  ? Anal stricture   ? Appendicolith   ? B12 deficiency   ? Crohn's colitis ([place])   ? Diverticulosis   ? Female cystocele   ? Hypertension   ? IDA (iron deficiency anemia)   ? incisional hernia   ? Proctitis   ? Ulcerative ileocolitis ([place])   ? ? ?Past Surgical History:  ?Procedure Laterality Date  ? APPENDECTOMY    ? COLONOSCOPY    ? DILATION AND CURETTAGE OF UTERUS    ? ILEOCECETOMY  01/06/2017  ? laparoscopic ileocecetomy with primary ileocolostomy  ? INCISIONAL HERNIA REPAIR    ? INCISIONAL HERNIA REPAIR N/A 12/23/2020  ? Procedure: INCISIONAL HERNIA REPAIR WITH MESH;  Surgeon: Stechschulte, Nickola Major, MD;  Location: [facility] ORS;  Service: General;  Laterality: N/A;  ROOM 1 STARTING AT 07:30AM FOR 150 MIN  ? TUBAL LIGATION    ? ? ?Prior to Admission medications   ?Medication Sig Start Date End Date Taking? Authorizing Provider  ?Cholecalciferol (VITAMIN D3) 25 MCG (1000 UT) CAPS Take 1,000 Units by mouth daily.   Yes [provider]  ?Cyanocobalamin (VITAMIN B12) 1000 MCG TBCR Take 1,000 mcg by mouth daily.   Yes [provider]  ?Olmesartan-Amlodipine-HCTZ 20-5-12.5 MG TABS Take 1 tablet by mouth daily. 02/28/17  Yes [provider]  ?Delsa Grana 90 MG/ML SOSY injection INJECT THE CONTENTS OF 1 SYRINGE UNDER THE SKIN EVERY 8 WEEKS. 01/05/21  Yes Ayiana Winslett, Lajuan Lines, MD  ?amoxicillin-clavulanate (AUGMENTIN) 875-125 MG tablet Take 1 tablet by mouth 2 (two) times daily. ?Patient not taking: Reported on 01/21/2022 12/11/21   Netra Postlethwait, Lajuan Lines, MD  ?dicyclomine (BENTYL) 10 MG capsule TAKE 1 -2 CAPSULE BY MOUTH 3(TIMES DAILY) AS NEEDED FOR ABDOMEN CRAMPING. 03/06/21   Maxx Pham, Lajuan Lines, MD  ?fluconazole (DIFLUCAN) 100 MG tablet Take 1 tablet on day 1 then repeat 5 days later ?Patient not taking: Reported on 01/21/2022 12/17/21   Alfredia Ferguson, PA-C  ?loperamide (IMODIUM) 2 MG capsule Take 1 capsule (2 mg total) by mouth 3 (three) times daily as needed for diarrhea or loose stools. 09/06/19   Curlie Sittner, Lajuan Lines, MD  ?ondansetron (ZOFRAN) 4 MG tablet Take 1 tablet (4 mg total) by mouth every 6 (six) hours as needed for nausea or vomiting. 09/06/19   Alonzo Owczarzak, Lajuan Lines, MD  ?predniSONE (DELTASONE) 10 MG tablet Take 2 tablets (20 mg total) by mouth daily with breakfast for 14 days, THEN 1.5 tablets (15 mg total) daily with breakfast for 14 days, THEN 1 tablet (10 mg total) daily with breakfast for 14 days. ?Patient not taking: Reported on 01/21/2022 12/17/21 01/28/22  Esterwood, Amy S, PA-C  ?traMADol (ULTRAM) 50 MG tablet Take 1 tablet (50 mg total) by mouth  every 6 (six) hours as needed. ?Patient not taking: Reported on 12/17/2021 12/10/21   Christofer Shen, Lajuan Lines, MD  ? ? ?Current Outpatient Medications  ?Medication Sig Dispense Refill  ? Cholecalciferol (VITAMIN D3) 25 MCG (1000 UT) CAPS Take 1,000 Units by mouth daily.    ? Cyanocobalamin (VITAMIN B12) 1000 MCG TBCR Take 1,000 mcg by mouth daily.    ? Olmesartan-Amlodipine-HCTZ 20-5-12.5 MG TABS Take 1 tablet by mouth daily.  1  ? STELARA 90 MG/ML SOSY injection INJECT THE CONTENTS OF 1 SYRINGE UNDER THE SKIN EVERY 8 WEEKS. 1 mL 4  ? amoxicillin-clavulanate (AUGMENTIN) 875-125 MG tablet Take 1 tablet by mouth 2 (two) times daily.  (Patient not taking: Reported on 01/21/2022) 28 tablet 0  ? dicyclomine (BENTYL) 10 MG capsule TAKE 1 -2 CAPSULE BY MOUTH 3(TIMES DAILY) AS NEEDED FOR ABDOMEN CRAMPING. 270 capsule 1  ? fluconazole (DIFLUCAN) 100 MG tablet Take 1 tablet on day 1 then repeat 5 days later (Patient not taking: Reported on 01/21/2022) 2 tablet 0  ? loperamide (IMODIUM) 2 MG capsule Take 1 capsule (2 mg total) by mouth 3 (three) times daily as needed for diarrhea or loose stools. 90 capsule 2  ? ondansetron (ZOFRAN) 4 MG tablet Take 1 tablet (4 mg total) by mouth every 6 (six) hours as needed for nausea or vomiting. 30 tablet 1  ? predniSONE (DELTASONE) 10 MG tablet Take 2 tablets (20 mg total) by mouth daily with breakfast for 14 days, THEN 1.5 tablets (15 mg total) daily with breakfast for 14 days, THEN 1 tablet (10 mg total) daily with breakfast for 14 days. (Patient not taking: Reported on 01/21/2022) 63 tablet 0  ? traMADol (ULTRAM) 50 MG tablet Take 1 tablet (50 mg total) by mouth every 6 (six) hours as needed. (Patient not taking: Reported on 12/17/2021) 30 tablet 0  ? ?Current Facility-Administered Medications  ?Medication Dose Route Frequency Provider Last Rate Last Admin  ? 0.9 %  sodium chloride infusion  500 mL Intravenous Once Sidnie Swalley, Lajuan Lines, MD      ? ? ?Allergies as of 01/21/2022 - Review Complete 01/21/2022  ?Allergen Reaction Noted  ? Biaxin [clarithromycin] Hives 02/03/2016  ? ? ?Family History  ?Problem Relation Age of Onset  ? Hypertension Mother   ? Osteoporosis Mother   ? Breast cancer Daughter 68  ? Colon cancer Neg Hx   ? ? ?Social History  ? ?Socioeconomic History  ? Marital status: Married  ?  Spouse name: Alda Lea  ? Number of children: 2  ? Years of education: Not on file  ? Highest education level: Not on file  ?Occupational History  ? Occupation: Software engineer   ?Tobacco Use  ? Smoking status: Never  ? Smokeless tobacco: Never  ?Vaping Use  ? Vaping Use: Never used  ?Substance and Sexual Activity  ? Alcohol use:  No  ?  Alcohol/week: 0.0 standard drinks  ? Drug use: No  ? Sexual activity: Yes  ?  Birth control/protection: Post-menopausal  ?Other Topics Concern  ? Not on file  ?Social History Narrative  ? Not on file  ? ?Social Determinants of Health  ? ?Financial Resource Strain: Not on file  ?Food Insecurity: Not on file  ?Transportation Needs: Not on file  ?Physical Activity: Not on file  ?Stress: Not on file  ?Social Connections: Not on file  ?Intimate Partner Violence: Not on file  ? ? ?Physical Exam: ?Vital signs in last 24 hours: ?@BP  (!) 143/68   Pulse  65   Temp (!) 96 ?F (35.6 ?C) (Temporal)   Ht 5' 2"  (1.575 m)   Wt 186 lb (84.4 kg)   SpO2 100%   BMI 34.02 kg/m?  ?GEN: NAD ?EYE: Sclerae anicteric ?ENT: MMM ?CV: Non-tachycardic ?Pulm: CTA b/l ?GI: Soft, NT/ND ?NEURO:  Alert & Oriented x 3 ? ? ?Zenovia Jarred, MD ?[facility] ? ?01/21/2022 10:32 AM ? ?

## 2022-01-21 NOTE — Progress Notes (Signed)
Called to room to assist during endoscopic procedure.  Patient ID and intended procedure confirmed with present staff. Received instructions for my participation in the procedure from the performing physician.  

## 2022-01-21 NOTE — Progress Notes (Signed)
VS completed by HF. ? ? ?Pt's states no medical or surgical changes since previsit or office visit. ? ?

## 2022-01-21 NOTE — Op Note (Addendum)
[place] ?Patient Name: Donna Richmond ?Procedure Date: 01/21/2022 10:34 AM ?MRN: [id number] ?Endoscopist: Jerene Bears , MD ?Age: 66 ?Referring MD:  ?Date of Birth: 1956/06/17 ?Gender: Female ?Account #: [id number] ?Procedure:                Colonoscopy ?Indications:              Last colonoscopy: July 2021, Disease activity  ?                          assessment of Crohn's disease of the small bowel  ?                          s/p ileocecectomy in 2018 and perianal Crohn's with  ?                          recent exacerbation (now much improved after 1  ?                          month of metronidazole and fluconazole); due to  ?                          improvement patient did not see Dr. Drue Flirt ?Medicines:                Monitored Anesthesia Care ?Procedure:                Pre-Anesthesia Assessment: ?                          - Prior to the procedure, a History and Physical  ?                          was performed, and patient medications and  ?                          allergies were reviewed. The patient's tolerance of  ?                          previous anesthesia was also reviewed. The risks  ?                          and benefits of the procedure and the sedation  ?                          options and risks were discussed with the patient.  ?                          All questions were answered, and informed consent  ?                          was obtained. Prior Anticoagulants: The patient has  ?                          taken no previous anticoagulant or antiplatelet  ?  agents. ASA Grade Assessment: II - A patient with  ?                          mild systemic disease. After reviewing the risks  ?                          and benefits, the patient was deemed in  ?                          satisfactory condition to undergo the procedure. ?                          After obtaining informed consent, the colonoscope  ?                          was passed under direct  vision. Throughout the  ?                          procedure, the patient's blood pressure, pulse, and  ?                          oxygen saturations were monitored continuously. The  ?                          PCF-HQ190L Colonoscope was introduced through the  ?                          anus and advanced to the terminal ileum. The  ?                          colonoscopy was performed without difficulty. The  ?                          patient tolerated the procedure well. The quality  ?                          of the bowel preparation was good. The terminal  ?                          ileum, ileocecal valve, appendiceal orifice, and  ?                          rectum were photographed. ?Scope In: 10:45:08 AM ?Scope Out: 11:00:12 AM ?Scope Withdrawal Time: 0 hours 13 minutes 8 seconds  ?Total Procedure Duration: 0 hours 15 minutes 4 seconds  ?Findings:                 The perianal exam findings include a skin tag,  ?                          mucosal thickening and anal canal stenosis. ?                          There was evidence of a prior end-to-side  ?  ileo-colonic anastomosis in the ascending colon.  ?                          This was patent and was characterized by healthy  ?                          appearing mucosa and an intact staple line. The  ?                          anastomosis was traversed. ?                          The neo-terminal ileum appeared normal. ?                          A few small-mouthed diverticula were found in the  ?                          sigmoid colon and descending colon. ?                          Normal mucosa was found in the entire colon. ?                          A single (solitary) five mm ulcer was found in the  ?                          anal canal. No bleeding was present. Biopsies were  ?                          taken with a cold forceps for histology. This is  ?                          very likely related to Crohn's disease. No fistula   ?                          seen. ?Complications:            No immediate complications. ?Estimated Blood Loss:     Estimated blood loss was minimal. ?Impression:               - Perianal skin thickening, skin tag and anal canal  ?                          stenosis found on perianal exam related to perianal  ?                          Crohn's. ?                          - Patent end-to-side ileo-colonic anastomosis,  ?                          characterized by healthy appearing mucosa and an  ?  intact staple line. ?                          - The examined portion of the neo-terminal ileum  ?                          was normal. ?                          - Few scattered diverticula in the sigmoid colon  ?                          and in the descending colon. ?                          - Normal mucosa in the entire examined colon. No  ?                          evidence of active colonic Crohn's. ?                          - A single (solitary) ulcer in the anal canal,  ?                          Crohn's related. Biopsied. ?Recommendation:           - Patient has a contact number available for  ?                          emergencies. The signs and symptoms of potential  ?                          delayed complications were discussed with the  ?                          patient. Return to normal activities tomorrow.  ?                          Written discharge instructions were provided to the  ?                          patient. ?                          - Resume previous diet. ?                          - Continue present medications. Awaiting Stelara  ?                          trough level and antibody test. ?                          - Await pathology results. ?                          - Repeat colonoscopy is recommended  for  ?                          surveillance. The colonoscopy date will be  ?                          determined after pathology results from today's  ?                           exam become available for review. ?                          - Return to GI office in 3 months. ?Jerene Bears, MD ?01/21/2022 11:09:21 AM ?This report has been signed electronically. ?

## 2022-01-21 NOTE — Patient Instructions (Signed)
HANDOUTS PROVIDED ON: DIVERTICULOSIS ? ?The biopsies taken today have been sent for pathology.  The results can take 1-3 weeks to receive.  When your next colonoscopy should occur will be based on the pathology results.   ? ?You may resume your previous diet and medication schedule. ? ?Thank you for allowing [place] to care for you today!!! ? ? ?YOU HAD AN ENDOSCOPIC PROCEDURE TODAY AT [facility]:   Refer to the procedure report that was given to you for any specific questions about what was found during the examination.  If the procedure report does not answer your questions, please call your gastroenterologist to clarify.  If you requested that your care partner not be given the details of your procedure findings, then the procedure report has been included in a sealed envelope for you to review at your convenience later. ? ?YOU SHOULD EXPECT: Some feelings of bloating in the abdomen. Passage of more gas than usual.  Walking can help get rid of the air that was put into your GI tract during the procedure and reduce the bloating. If you had a lower endoscopy (such as a colonoscopy or flexible sigmoidoscopy) you may notice spotting of blood in your stool or on the toilet paper. If you underwent a bowel prep for your procedure, you may not have a normal bowel movement for a few days. ? ?Please Note:  You might notice some irritation and congestion in your nose or some drainage.  This is from the oxygen used during your procedure.  There is no need for concern and it should clear up in a day or so. ? ?SYMPTOMS TO REPORT IMMEDIATELY: ? ?Following lower endoscopy (colonoscopy or flexible sigmoidoscopy): ? Excessive amounts of blood in the stool ? Significant tenderness or worsening of abdominal pains ? Swelling of the abdomen that is new, acute ? Fever of 100?F or higher ? ?For urgent or emergent issues, a gastroenterologist can be reached at any hour by calling [phone]. ?Do not use MyChart messaging  for urgent concerns.  ? ? ?DIET:  We do recommend a small meal at first, but then you may proceed to your regular diet.  Drink plenty of fluids but you should avoid alcoholic beverages for 24 hours. ? ?ACTIVITY:  You should plan to take it easy for the rest of today and you should NOT DRIVE or use heavy machinery until tomorrow (because of the sedation medicines used during the test).   ? ?FOLLOW UP: ?Our staff will call the number listed on your records Monday morning between 7:15 am and 8:15 am following your procedure to check on you and address any questions or concerns that you may have regarding the information given to you following your procedure. If we do not reach you, we will leave a message.  We will attempt to reach you two times.  During this call, we will ask if you have developed any symptoms of COVID 19. If you develop any symptoms (ie: fever, flu-like symptoms, shortness of breath, cough etc.) before then, please call [phone].  If you test positive for Covid 19 in the 2 weeks post procedure, please call and report this information to [place].   ? ?If any biopsies were taken you will be contacted by phone or by letter within the next 1-3 weeks.  Please call us at [phone] if you have not heard about the biopsies in 3 weeks.  ? ? ?SIGNATURES/CONFIDENTIALITY: ?You and/or your care partner have signed paperwork  which will be entered into your electronic medical record.  These signatures attest to the fact that that the information above on your After Visit Summary has been reviewed and is understood.  Full responsibility of the confidentiality of this discharge information lies with you and/or your care-partner. ? ?

## 2022-01-25 ENCOUNTER — Telehealth: Payer: Self-pay | Admitting: *Deleted

## 2022-01-25 ENCOUNTER — Encounter: Payer: Self-pay | Admitting: Internal Medicine

## 2022-01-25 NOTE — Telephone Encounter (Signed)
?  Follow up Call- ? ? ?  01/21/2022  ?  9:51 AM 04/08/2020  ?  1:16 PM  ?Call back number  ?Post procedure Call Back phone  # [phone]  ?Permission to leave phone message Yes Yes  ?  ?No answer at 2nd attempt follow up phone call. No VM, unable to LM ?

## 2022-01-25 NOTE — Telephone Encounter (Signed)
Attempted f/u phone call. No answer. Unable to leave message.  ?

## 2022-01-31 LAB — SERIAL MONITORING

## 2022-02-01 LAB — USTEKINUMAB AND ANTI-USTEK AB
Anti-Ustekinumab Antibody: <40 ng/mL
Ustekinumab: 4.3 ug/mL

## 2022-03-16 DIAGNOSIS — E782 Mixed hyperlipidemia: Secondary | ICD-10-CM | POA: Diagnosis not present

## 2022-03-16 DIAGNOSIS — Z131 Encounter for screening for diabetes mellitus: Secondary | ICD-10-CM | POA: Diagnosis not present

## 2022-03-22 DIAGNOSIS — M810 Age-related osteoporosis without current pathological fracture: Secondary | ICD-10-CM | POA: Diagnosis not present

## 2022-03-22 DIAGNOSIS — I129 Hypertensive chronic kidney disease with stage 1 through stage 4 chronic kidney disease, or unspecified chronic kidney disease: Secondary | ICD-10-CM | POA: Diagnosis not present

## 2022-03-22 DIAGNOSIS — E782 Mixed hyperlipidemia: Secondary | ICD-10-CM | POA: Diagnosis not present

## 2022-03-22 DIAGNOSIS — N182 Chronic kidney disease, stage 2 (mild): Secondary | ICD-10-CM | POA: Diagnosis not present

## 2022-04-01 DIAGNOSIS — I129 Hypertensive chronic kidney disease with stage 1 through stage 4 chronic kidney disease, or unspecified chronic kidney disease: Secondary | ICD-10-CM | POA: Diagnosis not present

## 2022-04-01 DIAGNOSIS — N182 Chronic kidney disease, stage 2 (mild): Secondary | ICD-10-CM | POA: Diagnosis not present

## 2022-04-01 DIAGNOSIS — E669 Obesity, unspecified: Secondary | ICD-10-CM | POA: Diagnosis not present

## 2022-04-01 DIAGNOSIS — Z7189 Other specified counseling: Secondary | ICD-10-CM | POA: Diagnosis not present

## 2022-04-08 ENCOUNTER — Encounter: Payer: Self-pay | Admitting: Internal Medicine

## 2022-04-08 ENCOUNTER — Ambulatory Visit: Payer: Medicare Other | Admitting: Internal Medicine

## 2022-04-08 VITALS — BP 142/80 | HR 71 | Ht 62.0 in | Wt 189.1 lb

## 2022-04-08 DIAGNOSIS — K5000 Crohn's disease of small intestine without complications: Secondary | ICD-10-CM

## 2022-04-08 DIAGNOSIS — K580 Irritable bowel syndrome with diarrhea: Secondary | ICD-10-CM | POA: Diagnosis not present

## 2022-04-08 DIAGNOSIS — K5010 Crohn's disease of large intestine without complications: Secondary | ICD-10-CM

## 2022-04-08 DIAGNOSIS — Z8719 Personal history of other diseases of the digestive system: Secondary | ICD-10-CM | POA: Diagnosis not present

## 2022-04-08 DIAGNOSIS — E538 Deficiency of other specified B group vitamins: Secondary | ICD-10-CM

## 2022-04-08 NOTE — Progress Notes (Signed)
   Subjective:    Patient ID: Donna Richmond, female    DOB: 1956/02/21, 66 y.o.   MRN: [id number]  HPI Donna Richmond is a 66 year old female with a history of ileal Crohn's with perianal involvement status post ileocecectomy in April 2018 maintained on Stelara, umbilical hernia status postrepair, IBS who is here for follow-up.  She is here today alone and was last seen at the time of her surveillance colonoscopy which I performed on 01/21/2022.  Colonoscopy revealed perianal skin tag mucosal thickening as well as anal canal stenosis.  The ileocolonic anastomosis in the ascending colon was healthy.  The neoterminal ileum appeared normal.  There was diverticulosis in the sigmoid and descending colon.  There is a single 5 mm ulcer in the anal canal felt secondary to Crohn's disease.  Biopsies confirmed chronic active colitis without dysplasia.  She reports she is feeling very well.  She has occasional anorectal pain and soreness if she has more than 2 bowel movements a day or if her stools are loose.  Normally bowel movements are twice daily.  If she eats fresh fruits and vegetables more than usual she can have 3-4 looser stool.  No abdominal pain.  No blood in stool.  No melena.  She continues B12 and vitamin D supplementation.  She will occasionally use loperamide if she has a social event plan on a day where she has had looser stool.  She denies upper GI or hepatobiliary complaint.   Review of Systems As per HPI, otherwise negative  Current Medications, Allergies, Past Medical History, Past Surgical History, Family History and Social History were reviewed in [employer] record.    Objective:   Physical Exam BP (!) 142/80   Pulse 71   Ht 5' 2"  (1.575 m)   Wt 189 lb 2 oz (85.8 kg)   BMI 34.59 kg/m  Gen: awake, alert, NAD HEENT: anicteric CV: RRR, no mrg Pulm: CTA b/l Abd: soft, NT/ND, +BS throughout Ext: no c/c/e Neuro: nonfocal  Stelara 4.3, ab < 40  (neg)     Latest Ref Rng & Units 12/17/2021    3:08 PM 12/10/2021    2:54 PM 12/24/2020   12:11 PM  CBC  WBC 4.0 - 10.5 K/uL 6.0  5.6  8.0   Hemoglobin 12.0 - 15.0 g/dL 13.2  13.5  10.9   Hematocrit 36.0 - 46.0 % 38.7  40.2  33.4   Platelets 150.0 - 400.0 K/uL 207.0  214.0  161          Assessment & Plan:  66 year old female with a history of ileal Crohn's with perianal involvement status post ileocecectomy in April 2018 maintained on Stelara, umbilical hernia status postrepair, IBS who is here for follow-up.    Ileal and perianal Crohn's disease/ileocecectomy 2018, dx > 10 yrs ago --she is doing well and Stelara.  Her Crohn's disease is in remission with the exception of the small ulceration in the distal rectum.  There is some post inflammatory anal stenosis but not symptomatic. --Continue Stelara 90 mg subcu every 8 weeks  2.  IBS with intermittent loose stool --she can use dicyclomine 10 to 20 mg 3 times daily as needed loperamide per box instruction as needed  3.  B12 deficiency --continue oral B12 daily  30 minutes total spent today including patient facing time, coordination of care, reviewing medical history/procedures/pertinent radiology studies, and documentation of the encounter.  Annual follow-up, sooner if needed

## 2022-04-08 NOTE — Patient Instructions (Signed)
Continue Stelara and B12 as prescribed.  Follow up with Dr Hilarie Fredrickson in 1 year.  If you are age 66 or older, your body mass index should be between 23-30. Your Body mass index is 34.59 kg/m. If this is out of the aforementioned range listed, please consider follow up with your Primary Care Provider.  If you are age 66 or younger, your body mass index should be between 19-25. Your Body mass index is 34.59 kg/m. If this is out of the aformentioned range listed, please consider follow up with your Primary Care Provider.   ________________________________________________________  The [facility] providers would like to encourage you to use [facility] to communicate with providers for non-urgent requests or questions.  Due to long hold times on the telephone, sending your provider a message by [facility] may be a faster and more efficient way to get a response.  Please allow 48 business hours for a response.  Please remember that this is for non-urgent requests.  _______________________________________________________  Due to recent changes in healthcare laws, you may see the results of your imaging and laboratory studies on MyChart before your provider has had a chance to review them.  We understand that in some cases there may be results that are confusing or concerning to you. Not all laboratory results come back in the same time frame and the provider may be waiting for multiple results in order to interpret others.  Please give [place] 48 hours in order for your provider to thoroughly review all the results before contacting the office for clarification of your results.

## 2022-04-28 DIAGNOSIS — J309 Allergic rhinitis, unspecified: Secondary | ICD-10-CM | POA: Diagnosis not present

## 2022-04-28 DIAGNOSIS — Z6832 Body mass index (BMI) 32.0-32.9, adult: Secondary | ICD-10-CM | POA: Diagnosis not present

## 2022-04-28 DIAGNOSIS — H1010 Acute atopic conjunctivitis, unspecified eye: Secondary | ICD-10-CM | POA: Diagnosis not present

## 2022-04-30 ENCOUNTER — Encounter: Payer: Self-pay | Admitting: Internal Medicine

## 2022-07-05 ENCOUNTER — Other Ambulatory Visit: Payer: Self-pay | Admitting: Obstetrics and Gynecology

## 2022-07-05 DIAGNOSIS — Z1231 Encounter for screening mammogram for malignant neoplasm of breast: Secondary | ICD-10-CM

## 2022-08-27 ENCOUNTER — Telehealth: Payer: Self-pay | Admitting: Pharmacy Technician

## 2022-09-01 NOTE — Telephone Encounter (Signed)
Called and spoke with patient about financial verification form. Pt reports her daughter faxed the form on yesterday 11.28.23. Will follow up with Ranelle Oyster to ensure they received form, and call back patient if they have not. Additionally, patient given my number if further assistance is needed.

## 2022-09-03 DIAGNOSIS — E782 Mixed hyperlipidemia: Secondary | ICD-10-CM | POA: Diagnosis not present

## 2022-09-03 DIAGNOSIS — I129 Hypertensive chronic kidney disease with stage 1 through stage 4 chronic kidney disease, or unspecified chronic kidney disease: Secondary | ICD-10-CM | POA: Diagnosis not present

## 2022-09-13 DIAGNOSIS — E782 Mixed hyperlipidemia: Secondary | ICD-10-CM | POA: Diagnosis not present

## 2022-09-13 DIAGNOSIS — I129 Hypertensive chronic kidney disease with stage 1 through stage 4 chronic kidney disease, or unspecified chronic kidney disease: Secondary | ICD-10-CM | POA: Diagnosis not present

## 2022-09-13 DIAGNOSIS — N182 Chronic kidney disease, stage 2 (mild): Secondary | ICD-10-CM | POA: Diagnosis not present

## 2022-09-13 DIAGNOSIS — I700 Atherosclerosis of aorta: Secondary | ICD-10-CM | POA: Diagnosis not present

## 2022-09-15 ENCOUNTER — Ambulatory Visit
Admission: RE | Admit: 2022-09-15 | Discharge: 2022-09-15 | Disposition: A | Discharge status: Home / Self Care | Payer: Medicare Other | Source: Ambulatory Visit | Attending: Obstetrics and Gynecology | Admitting: Obstetrics and Gynecology

## 2022-09-15 DIAGNOSIS — Z1231 Encounter for screening mammogram for malignant neoplasm of breast: Secondary | ICD-10-CM | POA: Diagnosis not present

## 2022-09-15 DIAGNOSIS — Z01419 Encounter for gynecological examination (general) (routine) without abnormal findings: Secondary | ICD-10-CM | POA: Diagnosis not present

## 2022-09-21 DIAGNOSIS — Z9181 History of falling: Secondary | ICD-10-CM | POA: Diagnosis not present

## 2022-09-21 DIAGNOSIS — N182 Chronic kidney disease, stage 2 (mild): Secondary | ICD-10-CM | POA: Diagnosis not present

## 2022-09-21 DIAGNOSIS — I129 Hypertensive chronic kidney disease with stage 1 through stage 4 chronic kidney disease, or unspecified chronic kidney disease: Secondary | ICD-10-CM | POA: Diagnosis not present

## 2022-09-21 DIAGNOSIS — Z6831 Body mass index (BMI) 31.0-31.9, adult: Secondary | ICD-10-CM | POA: Diagnosis not present

## 2022-09-22 ENCOUNTER — Other Ambulatory Visit: Payer: Self-pay | Admitting: Internal Medicine

## 2022-10-26 DIAGNOSIS — Z1331 Encounter for screening for depression: Secondary | ICD-10-CM | POA: Diagnosis not present

## 2022-10-26 DIAGNOSIS — N182 Chronic kidney disease, stage 2 (mild): Secondary | ICD-10-CM | POA: Diagnosis not present

## 2022-10-26 DIAGNOSIS — I129 Hypertensive chronic kidney disease with stage 1 through stage 4 chronic kidney disease, or unspecified chronic kidney disease: Secondary | ICD-10-CM | POA: Diagnosis not present

## 2022-10-26 DIAGNOSIS — Z139 Encounter for screening, unspecified: Secondary | ICD-10-CM | POA: Diagnosis not present

## 2022-10-26 DIAGNOSIS — Z6832 Body mass index (BMI) 32.0-32.9, adult: Secondary | ICD-10-CM | POA: Diagnosis not present

## 2022-11-23 DIAGNOSIS — Z139 Encounter for screening, unspecified: Secondary | ICD-10-CM | POA: Diagnosis not present

## 2022-11-23 DIAGNOSIS — Z6831 Body mass index (BMI) 31.0-31.9, adult: Secondary | ICD-10-CM | POA: Diagnosis not present

## 2022-11-23 DIAGNOSIS — N182 Chronic kidney disease, stage 2 (mild): Secondary | ICD-10-CM | POA: Diagnosis not present

## 2022-11-23 DIAGNOSIS — I129 Hypertensive chronic kidney disease with stage 1 through stage 4 chronic kidney disease, or unspecified chronic kidney disease: Secondary | ICD-10-CM | POA: Diagnosis not present

## 2022-11-23 DIAGNOSIS — K5090 Crohn's disease, unspecified, without complications: Secondary | ICD-10-CM | POA: Diagnosis not present

## 2023-02-15 DIAGNOSIS — E782 Mixed hyperlipidemia: Secondary | ICD-10-CM | POA: Diagnosis not present

## 2023-02-15 DIAGNOSIS — I129 Hypertensive chronic kidney disease with stage 1 through stage 4 chronic kidney disease, or unspecified chronic kidney disease: Secondary | ICD-10-CM | POA: Diagnosis not present

## 2023-02-22 DIAGNOSIS — I129 Hypertensive chronic kidney disease with stage 1 through stage 4 chronic kidney disease, or unspecified chronic kidney disease: Secondary | ICD-10-CM | POA: Diagnosis not present

## 2023-02-22 DIAGNOSIS — Z6831 Body mass index (BMI) 31.0-31.9, adult: Secondary | ICD-10-CM | POA: Diagnosis not present

## 2023-02-22 DIAGNOSIS — E782 Mixed hyperlipidemia: Secondary | ICD-10-CM | POA: Diagnosis not present

## 2023-02-22 DIAGNOSIS — N182 Chronic kidney disease, stage 2 (mild): Secondary | ICD-10-CM | POA: Diagnosis not present

## 2023-03-07 ENCOUNTER — Other Ambulatory Visit: Payer: Self-pay | Admitting: Obstetrics and Gynecology

## 2023-03-07 DIAGNOSIS — M810 Age-related osteoporosis without current pathological fracture: Secondary | ICD-10-CM

## 2023-05-05 ENCOUNTER — Other Ambulatory Visit: Payer: Self-pay | Admitting: Obstetrics and Gynecology

## 2023-05-05 DIAGNOSIS — Z1231 Encounter for screening mammogram for malignant neoplasm of breast: Secondary | ICD-10-CM

## 2023-05-23 DIAGNOSIS — H5203 Hypermetropia, bilateral: Secondary | ICD-10-CM | POA: Diagnosis not present

## 2023-05-27 DIAGNOSIS — H524 Presbyopia: Secondary | ICD-10-CM | POA: Diagnosis not present

## 2023-07-25 DIAGNOSIS — E782 Mixed hyperlipidemia: Secondary | ICD-10-CM | POA: Diagnosis not present

## 2023-07-25 DIAGNOSIS — I129 Hypertensive chronic kidney disease with stage 1 through stage 4 chronic kidney disease, or unspecified chronic kidney disease: Secondary | ICD-10-CM | POA: Diagnosis not present

## 2023-08-01 DIAGNOSIS — Z136 Encounter for screening for cardiovascular disorders: Secondary | ICD-10-CM | POA: Diagnosis not present

## 2023-08-01 DIAGNOSIS — Z1339 Encounter for screening examination for other mental health and behavioral disorders: Secondary | ICD-10-CM | POA: Diagnosis not present

## 2023-08-01 DIAGNOSIS — Z0000 Encounter for general adult medical examination without abnormal findings: Secondary | ICD-10-CM | POA: Diagnosis not present

## 2023-08-01 DIAGNOSIS — Z6832 Body mass index (BMI) 32.0-32.9, adult: Secondary | ICD-10-CM | POA: Diagnosis not present

## 2023-08-01 DIAGNOSIS — J309 Allergic rhinitis, unspecified: Secondary | ICD-10-CM | POA: Diagnosis not present

## 2023-08-01 DIAGNOSIS — I129 Hypertensive chronic kidney disease with stage 1 through stage 4 chronic kidney disease, or unspecified chronic kidney disease: Secondary | ICD-10-CM | POA: Diagnosis not present

## 2023-08-01 DIAGNOSIS — N182 Chronic kidney disease, stage 2 (mild): Secondary | ICD-10-CM | POA: Diagnosis not present

## 2023-08-01 DIAGNOSIS — Z139 Encounter for screening, unspecified: Secondary | ICD-10-CM | POA: Diagnosis not present

## 2023-08-01 DIAGNOSIS — Z1331 Encounter for screening for depression: Secondary | ICD-10-CM | POA: Diagnosis not present

## 2023-08-01 DIAGNOSIS — E782 Mixed hyperlipidemia: Secondary | ICD-10-CM | POA: Diagnosis not present

## 2023-08-01 DIAGNOSIS — E669 Obesity, unspecified: Secondary | ICD-10-CM | POA: Diagnosis not present

## 2023-09-16 ENCOUNTER — Other Ambulatory Visit: Payer: Self-pay | Admitting: Internal Medicine

## 2023-09-20 ENCOUNTER — Ambulatory Visit: Payer: Medicare Other

## 2023-09-22 ENCOUNTER — Ambulatory Visit
Admission: RE | Admit: 2023-09-22 | Discharge: 2023-09-22 | Disposition: A | Discharge status: Home / Self Care | Payer: Medicare Other | Source: Ambulatory Visit | Attending: Obstetrics and Gynecology | Admitting: Obstetrics and Gynecology

## 2023-09-22 DIAGNOSIS — Z01419 Encounter for gynecological examination (general) (routine) without abnormal findings: Secondary | ICD-10-CM | POA: Diagnosis not present

## 2023-09-22 DIAGNOSIS — Z1231 Encounter for screening mammogram for malignant neoplasm of breast: Secondary | ICD-10-CM | POA: Diagnosis not present

## 2023-10-07 ENCOUNTER — Ambulatory Visit: Payer: Medicare Other | Admitting: Internal Medicine

## 2023-10-07 ENCOUNTER — Other Ambulatory Visit (INDEPENDENT_AMBULATORY_CARE_PROVIDER_SITE_OTHER): Payer: Medicare Other

## 2023-10-07 ENCOUNTER — Encounter: Payer: Self-pay | Admitting: Internal Medicine

## 2023-10-07 VITALS — BP 130/62 | HR 66 | Ht 63.0 in | Wt 184.0 lb

## 2023-10-07 DIAGNOSIS — R1319 Other dysphagia: Secondary | ICD-10-CM | POA: Diagnosis not present

## 2023-10-07 DIAGNOSIS — K5010 Crohn's disease of large intestine without complications: Secondary | ICD-10-CM | POA: Diagnosis not present

## 2023-10-07 DIAGNOSIS — E559 Vitamin D deficiency, unspecified: Secondary | ICD-10-CM

## 2023-10-07 DIAGNOSIS — K5000 Crohn's disease of small intestine without complications: Secondary | ICD-10-CM | POA: Diagnosis not present

## 2023-10-07 DIAGNOSIS — R110 Nausea: Secondary | ICD-10-CM

## 2023-10-07 DIAGNOSIS — E538 Deficiency of other specified B group vitamins: Secondary | ICD-10-CM | POA: Diagnosis not present

## 2023-10-07 LAB — CBC WITH DIFFERENTIAL/PLATELET
Basophils Absolute: 0 10*3/uL (ref 0.0–0.1)
Basophils Relative: 0.6 % (ref 0.0–3.0)
Eosinophils Absolute: 0.1 10*3/uL (ref 0.0–0.7)
Eosinophils Relative: 2.2 % (ref 0.0–5.0)
HCT: 38.5 % (ref 36.0–46.0)
Hemoglobin: 13.2 g/dL (ref 12.0–15.0)
Lymphocytes Relative: 28.5 % (ref 12.0–46.0)
Lymphs Abs: 1.4 10*3/uL (ref 0.7–4.0)
MCHC: 34.3 g/dL (ref 30.0–36.0)
MCV: 87.8 fL (ref 78.0–100.0)
Monocytes Absolute: 0.3 10*3/uL (ref 0.1–1.0)
Monocytes Relative: 7 % (ref 3.0–12.0)
Neutro Abs: 3 10*3/uL (ref 1.4–7.7)
Neutrophils Relative %: 61.7 % (ref 43.0–77.0)
Platelets: 284 10*3/uL (ref 150.0–400.0)
RBC: 4.39 Mil/uL (ref 3.87–5.11)
RDW: 12.6 % (ref 11.5–15.5)
WBC: 4.8 10*3/uL (ref 4.0–10.5)

## 2023-10-07 LAB — COMPREHENSIVE METABOLIC PANEL
ALT: 16 U/L (ref 0–35)
AST: 13 U/L (ref 0–37)
Albumin: 4.5 g/dL (ref 3.5–5.2)
Alkaline Phosphatase: 74 U/L (ref 39–117)
BUN: 20 mg/dL (ref 6–23)
CO2: 26 meq/L (ref 19–32)
Calcium: 9.8 mg/dL (ref 8.4–10.5)
Chloride: 101 meq/L (ref 96–112)
Creatinine, Ser: 0.71 mg/dL (ref 0.40–1.20)
GFR: 87.81 mL/min (ref >60.00)
Glucose, Bld: 98 mg/dL (ref 70–99)
Potassium: 3.1 meq/L — ABNORMAL LOW (ref 3.5–5.1)
Sodium: 139 meq/L (ref 135–145)
Total Bilirubin: 0.6 mg/dL (ref 0.2–1.2)
Total Protein: 7.3 g/dL (ref 6.0–8.3)

## 2023-10-07 LAB — VITAMIN D 25 HYDROXY (VIT D DEFICIENCY, FRACTURES): VITD: 28.81 ng/mL — ABNORMAL LOW (ref 30.00–100.00)

## 2023-10-07 LAB — VITAMIN B12: Vitamin B-12: >1537 pg/mL — ABNORMAL HIGH (ref 211–911)

## 2023-10-07 NOTE — Progress Notes (Signed)
   Subjective:    Patient ID: Donna Richmond, female    DOB: [birth year], 68 y.o.   MRN: [id number]  HPI Donna Richmond is a 68 year old female with a history of ileal Crohn's with perianal involvement status post ileocecectomy in April 2018 maintained on Stelara , umbilical hernia status postrepair, IBS who is here for follow-up. She is here today alone and was last seen in summer 2023.  Colonoscopy April 2023 revealed perianal skin tag mucosal thickening as well as anal canal stenosis. The ileocolonic anastomosis in the ascending colon was healthy. The neoterminal ileum appeared normal. There was diverticulosis in the sigmoid and descending colon. There is a single 5 mm ulcer in the anal canal felt secondary to Crohn's disease. Biopsies confirmed chronic active colitis without dysplasia.   She reports continued success with Stelara , administered every eight weeks, with no adverse skin reactions beyond minor swelling at the injection site. She denies any significant issues related to her Crohn's disease, attributing occasional discomfort to dietary choices.  However, the patient reports a new issue of dysphagia, characterized by a sensation of food getting stuck mid-chest during meals. This is occasionally accompanied by hiccups and leads to regurgitation of a foamy substance. The patient notes that this does not occur with every meal and seems to be more common when consuming certain foods like french fries and cheeseburgers. She denies any associated heartburn.  The patient also reports a history of a incisional hernia, which has since been repaired and is no longer causing discomfort. She has been managing bowel movements well, with no reported pain, bleeding, or difficulty passing stool. She has not needed to use antidiarrheal or cramping medication recently. She continues to take B12 supplements but has discontinued vitamin D3. She has not experienced any nausea requiring Zofran .  Review of  Systems As per HPI, otherwise negative  Current Medications, Allergies, Past Medical History, Past Surgical History, Family History and Social History were reviewed in [employer] record.     Objective:   Physical Exam BP 130/62   Pulse 66   Ht 5' 3 (1.6 m)   Wt 184 lb (83.5 kg)   BMI 32.59 kg/m  Gen: awake, alert, NAD HEENT: anicteric  CV: RRR, no mrg Pulm: CTA b/l Abd: soft, NT/ND, +BS throughout Ext: no c/c/e Neuro: nonfocal      Assessment & Plan:  Crohn's Disease -- ileal and perianal Stable on Stelara  every 8 weeks. No current issues with bowel movements, no perianal infections or abscesses, no bleeding, and no abdominal pain. -Continue Stelara  90 mg every 8 weeks as prescribed. -Check on paperwork for Stelara  approval. -CBC, CMP and QuantiFERON gold  Esophageal Dysphagia Reports occasional difficulty swallowing with certain foods, associated with hiccups. No heartburn. Likely narrowing in the lower esophagus. -Schedule upper endoscopy to evaluate and potentially dilation -Nothing to eat or drink after midnight before the procedure.  Vitamin D  Deficiency Stopped taking Vitamin D3 supplement. -Check Vitamin D  levels in lab work.  Nausea No current issues but has used Zofran  in the past. -Send refill for Zofran  for as needed use  Vitamin B12 Deficiency Currently taking B12 supplement. -Continue B12 supplement. -Check B12 today  General Health Maintenance -Complete lab work on the way out.

## 2023-10-07 NOTE — Patient Instructions (Signed)
 Your provider has requested that you go to the basement level for lab work before leaving today. Press B on the elevator. The lab is located at the first door on the left as you exit the elevator.  You have been scheduled for an endoscopy. Please follow written instructions given to you at your visit today.  If you use inhalers (even only as needed), please bring them with you on the day of your procedure.  If you take any of the following medications, they will need to be adjusted prior to your procedure:   DO NOT TAKE 7 DAYS PRIOR TO TEST- Trulicity (dulaglutide) Ozempic, Wegovy (semaglutide) Mounjaro (tirzepatide) Bydureon Bcise (exanatide extended release)  DO NOT TAKE 1 DAY PRIOR TO YOUR TEST Rybelsus (semaglutide) Adlyxin (lixisenatide) Victoza (liraglutide) Byetta (exanatide) ___________________________________________________________________________   If your blood pressure at your visit was 140/90 or greater, please contact your primary care physician to follow up on this.  _______________________________________________________  If you are age 19 or older, your body mass index should be between 23-30. Your Body mass index is 32.59 kg/m. If this is out of the aforementioned range listed, please consider follow up with your Primary Care Provider.  If you are age 16 or younger, your body mass index should be between 19-25. Your Body mass index is 32.59 kg/m. If this is out of the aformentioned range listed, please consider follow up with your Primary Care Provider.   ________________________________________________________  The [facility] providers would like to encourage you to use MYCHART to communicate with providers for non-urgent requests or questions.  Due to long hold times on the telephone, sending your provider a message by [facility] may be a faster and more efficient way to get a response.  Please allow 48 business hours for a response.  Please remember that this is  for non-urgent requests.  _______________________________________________________

## 2023-10-10 ENCOUNTER — Ambulatory Visit: Payer: Medicare Other | Admitting: Internal Medicine

## 2023-10-10 ENCOUNTER — Encounter: Payer: Self-pay | Admitting: Internal Medicine

## 2023-10-10 VITALS — BP 126/58 | HR 80 | Temp 98.0°F | Resp 22 | Ht 63.0 in | Wt 184.0 lb

## 2023-10-10 DIAGNOSIS — K222 Esophageal obstruction: Secondary | ICD-10-CM | POA: Diagnosis not present

## 2023-10-10 DIAGNOSIS — R1319 Other dysphagia: Secondary | ICD-10-CM

## 2023-10-10 MED ORDER — SODIUM CHLORIDE 0.9 % IV SOLN: 500.0000 mL | Freq: Once | INTRAVENOUS | Status: DC

## 2023-10-10 NOTE — Progress Notes (Signed)
 See office note dated 10/07/2023 for details and current H&P  Patient presenting for upper endoscopy for esophageal dysphagia  She remains appropriate for [facility] upper endoscopy with possible dilation today

## 2023-10-10 NOTE — Progress Notes (Signed)
 Pt's states no medical or surgical changes since previsit or office visit.

## 2023-10-10 NOTE — Progress Notes (Signed)
 Report to PACU, RN, vss, BBS= Clear.

## 2023-10-10 NOTE — Patient Instructions (Signed)
 Thank you for letting us  care for your healthcare needs! Please see handouts regarding Stricture and Dilation Diet. Please do clear liquids only for 1 hour- until 10:45 AM. Start Soft Foods only (see handout) at 10:45 AM for the rest of today. Call for any difficulty swallowing.  YOU HAD AN ENDOSCOPIC PROCEDURE TODAY AT THE  ENDOSCOPY CENTER:   Refer to the procedure report that was given to you for any specific questions about what was found during the examination.  If the procedure report does not answer your questions, please call your gastroenterologist to clarify.  If you requested that your care partner not be given the details of your procedure findings, then the procedure report has been included in a sealed envelope for you to review at your convenience later.  YOU SHOULD EXPECT: Some feelings of bloating in the abdomen. Passage of more gas than usual.  Walking can help get rid of the air that was put into your GI tract during the procedure and reduce the bloating. If you had a lower endoscopy (such as a colonoscopy or flexible sigmoidoscopy) you may notice spotting of blood in your stool or on the toilet paper. If you underwent a bowel prep for your procedure, you may not have a normal bowel movement for a few days.  Please Note:  You might notice some irritation and congestion in your nose or some drainage.  This is from the oxygen used during your procedure.  There is no need for concern and it should clear up in a day or so.  SYMPTOMS TO REPORT IMMEDIATELY:  Following upper endoscopy (EGD)  Vomiting of blood or coffee ground material  New chest pain or pain under the shoulder blades  Painful or persistently difficult swallowing  New shortness of breath  Fever of 100F or higher  Black, tarry-looking stools  For urgent or emergent issues, a gastroenterologist can be reached at any hour by calling [phone]. Do not use MyChart messaging for urgent concerns.    DIET:  We  do recommend a small meal at first, but then you may proceed to your regular diet.  Drink plenty of fluids but you should avoid alcoholic beverages for 24 hours.  ACTIVITY:  You should plan to take it easy for the rest of today and you should NOT DRIVE or use heavy machinery until tomorrow (because of the sedation medicines used during the test).    FOLLOW UP: Our staff will call the number listed on your records the next business day following your procedure.  We will call around 7:15- 8:00 am to check on you and address any questions or concerns that you may have regarding the information given to you following your procedure. If we do not reach you, we will leave a message.     If any biopsies were taken you will be contacted by phone or by letter within the next 1-3 weeks.  Please call us  at [phone] if you have not heard about the biopsies in 3 weeks.    SIGNATURES/CONFIDENTIALITY: You and/or your care partner have signed paperwork which will be entered into your electronic medical record.  These signatures attest to the fact that that the information above on your After Visit Summary has been reviewed and is understood.  Full responsibility of the confidentiality of this discharge information lies with you and/or your care-partner.

## 2023-10-10 NOTE — Op Note (Signed)
 [facility] Patient Name: Donna Richmond Procedure Date: 10/10/2023 9:18 AM MRN: [id number] Endoscopist: Gordy CHRISTELLA Starch , MD, [id number] Age: 68 Referring MD:  Date of Birth: 26-Oct-1955 Gender: Female Account #: [id number] Procedure:                Upper GI endoscopy Indications:              Dysphagia Medicines:                Monitored Anesthesia Care Procedure:                Pre-Anesthesia Assessment:                           - Prior to the procedure, a History and Physical                            was performed, and patient medications and                            allergies were reviewed. The patient's tolerance of                            previous anesthesia was also reviewed. The risks                            and benefits of the procedure and the sedation                            options and risks were discussed with the patient.                            All questions were answered, and informed consent                            was obtained. Prior Anticoagulants: The patient has                            taken no anticoagulant or antiplatelet agents. ASA                            Grade Assessment: II - A patient with mild systemic                            disease. After reviewing the risks and benefits,                            the patient was deemed in satisfactory condition to                            undergo the procedure.                           After obtaining informed consent, the endoscope was  passed under direct vision. Throughout the                            procedure, the patient's blood pressure, pulse, and                            oxygen saturations were monitored continuously. The                            GIF [id number] #[id number] was introduced through the                            mouth, and advanced to the second part of duodenum.                            The upper GI endoscopy was accomplished without                             difficulty. The patient tolerated the procedure                            well. Scope In: Scope Out: Findings:                 One benign-appearing, intrinsic moderate                            (circumferential scarring or stenosis; an endoscope                            may pass) stenosis was found 39 cm from the                            incisors. This stenosis measured 1.2 cm (inner                            diameter) x less than one cm (in length). The                            stenosis was traversed. A TTS dilator was passed                            through the scope. Dilation with a 15-16.5-18 mm                            balloon dilator was performed to 16.5 mm. The                            dilation site was examined and showed moderate                            mucosal disruption.                           The exam of  the esophagus was otherwise normal.                           The cardia and gastric fundus were normal on                            retroflexion.                           The entire examined stomach was normal.                           The examined duodenum was normal. Complications:            No immediate complications. Estimated Blood Loss:     Estimated blood loss was minimal. Impression:               - Benign-appearing esophageal stenosis at GE                            junction. Dilated to 16.5 mm with balloon.                           - Normal stomach.                           - Normal examined duodenum.                           - No specimens collected. Recommendation:           - Patient has a contact number available for                            emergencies. The signs and symptoms of potential                            delayed complications were discussed with the                            patient. Return to normal activities tomorrow.                            Written discharge instructions were  provided to the                            patient.                           - Advance diet as tolerated.                           - Continue present medications.                           - Repeat upper endoscopy PRN for retreatment.  Please let me know if you are still having issues                            with swallowing solid foods. Gordy CHRISTELLA Starch, MD 10/10/2023 9:34:37 AM This report has been signed electronically.

## 2023-10-11 ENCOUNTER — Telehealth: Payer: Self-pay

## 2023-10-11 LAB — QUANTIFERON-TB GOLD PLUS
Mitogen-NIL: 8.64 [IU]/mL
NIL: 0.02 [IU]/mL
QuantiFERON-TB Gold Plus: NEGATIVE
TB1-NIL: 0.01 [IU]/mL
TB2-NIL: 0.01 [IU]/mL

## 2023-10-11 NOTE — Telephone Encounter (Signed)
  Follow up Call-     10/10/2023    8:26 AM 01/21/2022    9:51 AM  Call back number  Post procedure Call Back phone  # [phone]  Permission to leave phone message Yes Yes     Patient questions:  Do you have a fever, pain , or abdominal swelling? No. Pain Score  0 *  Have you tolerated food without any problems? Yes.    Have you been able to return to your normal activities? Yes.    Do you have any questions about your discharge instructions: Diet   No. Medications  No. Follow up visit  No.  Do you have questions or concerns about your Care? No.  Actions: * If pain score is 4 or above: No action needed, pain <4.

## 2023-11-09 ENCOUNTER — Ambulatory Visit
Admission: RE | Admit: 2023-11-09 | Discharge: 2023-11-09 | Disposition: A | Discharge status: Home / Self Care | Payer: Medicare Other | Source: Ambulatory Visit | Attending: Obstetrics and Gynecology | Admitting: Obstetrics and Gynecology

## 2023-11-09 DIAGNOSIS — M8588 Other specified disorders of bone density and structure, other site: Secondary | ICD-10-CM | POA: Diagnosis not present

## 2023-11-09 DIAGNOSIS — E2839 Other primary ovarian failure: Secondary | ICD-10-CM | POA: Diagnosis not present

## 2023-11-09 DIAGNOSIS — M810 Age-related osteoporosis without current pathological fracture: Secondary | ICD-10-CM

## 2023-11-09 DIAGNOSIS — N958 Other specified menopausal and perimenopausal disorders: Secondary | ICD-10-CM | POA: Diagnosis not present

## 2023-12-27 DIAGNOSIS — E782 Mixed hyperlipidemia: Secondary | ICD-10-CM | POA: Diagnosis not present

## 2023-12-27 DIAGNOSIS — I129 Hypertensive chronic kidney disease with stage 1 through stage 4 chronic kidney disease, or unspecified chronic kidney disease: Secondary | ICD-10-CM | POA: Diagnosis not present

## 2024-01-03 DIAGNOSIS — I129 Hypertensive chronic kidney disease with stage 1 through stage 4 chronic kidney disease, or unspecified chronic kidney disease: Secondary | ICD-10-CM | POA: Diagnosis not present

## 2024-01-03 DIAGNOSIS — Z6832 Body mass index (BMI) 32.0-32.9, adult: Secondary | ICD-10-CM | POA: Diagnosis not present

## 2024-01-03 DIAGNOSIS — J309 Allergic rhinitis, unspecified: Secondary | ICD-10-CM | POA: Diagnosis not present

## 2024-01-03 DIAGNOSIS — E782 Mixed hyperlipidemia: Secondary | ICD-10-CM | POA: Diagnosis not present

## 2024-01-03 DIAGNOSIS — N182 Chronic kidney disease, stage 2 (mild): Secondary | ICD-10-CM | POA: Diagnosis not present

## 2024-05-23 DIAGNOSIS — D3131 Benign neoplasm of right choroid: Secondary | ICD-10-CM | POA: Diagnosis not present

## 2024-06-06 ENCOUNTER — Telehealth: Payer: Self-pay | Admitting: Internal Medicine

## 2024-06-06 NOTE — Telephone Encounter (Signed)
 Patient calling to schedule with provider. Declined to schedule with PA. Requesting to speak with a nurse. Please advise.   Thank you

## 2024-06-06 NOTE — Telephone Encounter (Signed)
 Pt scheduled to see Dr Albertus 06/19/24@8 :30am Please let pt know about appt date and time.

## 2024-06-14 NOTE — Telephone Encounter (Signed)
 Patient confirmed

## 2024-06-19 ENCOUNTER — Ambulatory Visit: Admitting: Internal Medicine

## 2024-06-19 ENCOUNTER — Encounter: Payer: Self-pay | Admitting: Internal Medicine

## 2024-06-19 VITALS — BP 110/70 | HR 88 | Ht 62.6 in | Wt 187.4 lb

## 2024-06-19 DIAGNOSIS — Z8719 Personal history of other diseases of the digestive system: Secondary | ICD-10-CM | POA: Diagnosis not present

## 2024-06-19 DIAGNOSIS — K5010 Crohn's disease of large intestine without complications: Secondary | ICD-10-CM

## 2024-06-19 DIAGNOSIS — K5000 Crohn's disease of small intestine without complications: Secondary | ICD-10-CM

## 2024-06-19 DIAGNOSIS — E559 Vitamin D deficiency, unspecified: Secondary | ICD-10-CM | POA: Diagnosis not present

## 2024-06-19 DIAGNOSIS — E538 Deficiency of other specified B group vitamins: Secondary | ICD-10-CM | POA: Diagnosis not present

## 2024-06-19 DIAGNOSIS — R1319 Other dysphagia: Secondary | ICD-10-CM

## 2024-06-19 MED ORDER — LOPERAMIDE HCL 2 MG PO CAPS: 2.0000 mg | ORAL_CAPSULE | Freq: Three times a day (TID) | ORAL | 2 refills | Status: AC | PRN

## 2024-06-19 NOTE — Progress Notes (Signed)
 Subjective:    Patient ID: Donna Richmond, female    DOB: 08-21-1956, 68 y.o.   MRN: [id number]  HPI Teasia Zapf is a 68 year old female with a history of ileal Crohn's with perianal involvement status post ileocecectomy in April 2018 maintained on Stelara , umbilical hernia status post repair, IBS who is here for follow-up.   She continues to receive Stelara  injections every eight weeks for her Crohn's disease. The injections cause pain and swelling at the site when administered by her husband, but the discomfort subsides after the injection is complete. Her bowel movements are generally good, with no bleeding or pain, except during flare-ups, which she attributes to dietary choices. During flare-ups, she uses A&D ointment to manage symptoms, which she finds effective. No current abdominal pain, bleeding, or nausea.  She underwent an upper endoscopy in January 2025, which revealed a stricture at the lower esophagus that was dilated to 16.5 mm. She reports no current issues with swallowing and no heartburn or indigestion.  She is taking vitamin D  and B12 supplements, as she was previously told her vitamin D  level was slightly low in January 2025. She does not frequently use Bentyl  for cramping, having taken it only once in the past, but she keeps it on hand in case of severe pain.  She feels well overall.  Review of Systems As per HPI, otherwise negative  Current Medications, Allergies, Past Medical History, Past Surgical History, Family History and Social History were reviewed in [employer] record.    Objective:   Physical Exam BP 110/70   Pulse 88   Ht 5' 2.6 (1.59 m) Comment: height without shoes  Wt 187 lb 6 oz (85 kg)   BMI 33.62 kg/m  Gen: awake, alert, NAD HEENT: anicteric  Abd: soft, NT/ND, +BS throughout Ext: no c/c/e Neuro: nonfocal  Lab Results  Component Value Date   VITAMINB12 >1537 (H) 10/07/2023   Last vitamin D  Lab Results   Component Value Date   VD25OH 28.81 (L) 10/07/2023      Latest Ref Rng & Units 10/07/2023    9:15 AM 12/17/2021    3:08 PM 12/10/2021    2:54 PM  CBC  WBC 4.0 - 10.5 K/uL 4.8  6.0  5.6   Hemoglobin 12.0 - 15.0 g/dL 86.7  86.7  86.4   Hematocrit 36.0 - 46.0 % 38.5  38.7  40.2   Platelets 150.0 - 400.0 K/uL 284.0  207.0  214.0    CMP     Component Value Date/Time   NA 139 10/07/2023 0915   K 3.1 (L) 10/07/2023 0915   CL 101 10/07/2023 0915   CO2 26 10/07/2023 0915   GLUCOSE 98 10/07/2023 0915   BUN 20 10/07/2023 0915   CREATININE 0.71 10/07/2023 0915   CALCIUM  9.8 10/07/2023 0915   PROT 7.3 10/07/2023 0915   ALBUMIN 4.5 10/07/2023 0915   AST 13 10/07/2023 0915   ALT 16 10/07/2023 0915   ALKPHOS 74 10/07/2023 0915   BILITOT 0.6 10/07/2023 0915   GFR 87.81 10/07/2023 0915   GFRNONAA >60 12/24/2020 0455    DIAGNOSTIC Upper endoscopy: Stricture at lower esophagus dilated to 16.5 mm, rest of exam normal (10/10/2023)    Assessment & Plan:   Crohn's disease of small bowel and perianal region (dx > 10 yrs) Crohn's disease is well-managed with Stelara , with occasional diet-related flare-ups (more likely irritable) and no significant bowel symptoms, bleeding, or pain. Injection site discomfort is likely due to technique.  Stelara  is preferred due to insurance coverage, with alternatives available if needed. - Continue Stelara  every eight weeks. - Advise slow injection technique for Stelara  to reduce discomfort. - Recommend taking acetaminophen  an hour before Stelara  injection to alleviate pain. - Refill Bentyl  10 mg 3 times daily as needed for occasional cramping, though rarely used. - Repeat colonoscopy recommended April 2028  Esophageal stricture, status post dilation Esophageal stricture was successfully dilated in January with no current swallowing difficulties or heartburn. - Monitor for symptoms indicating need for future dilation.  Vitamin D  deficiency Vitamin D  level was  slightly low in January at 28. Advised to maintain levels above 30, ideally over 40. - Continue vitamin D  supplementation 1000-2000 international units daily .  Vitamin B12 deficiency Vitamin B12 levels are currently adequate. - Continue vitamin B12 supplementation.  Follow-up in 9 to 12 months  30 minutes total spent today including patient facing time, coordination of care, reviewing medical history/procedures/pertinent radiology studies, and documentation of the encounter.

## 2024-06-19 NOTE — Patient Instructions (Signed)
 Continue Stelara , vitamin b12 and vitamin D .   We have sent the following medications to your pharmacy for you to pick up at your convenience: bentyl .  Our office will contact you in January to have you come in for repeat labs (Quant Gold, Vitamin D , CBC, CMP).   Dr. Albertus would like to see you in 9-12 months.   _______________________________________________________  If your blood pressure at your visit was 140/90 or greater, please contact your primary care physician to follow up on this.  _______________________________________________________  If you are age 68 or older, your body mass index should be between 23-30. Your Body mass index is 33.62 kg/m. If this is out of the aforementioned range listed, please consider follow up with your Primary Care Provider.  If you are age 34 or younger, your body mass index should be between 19-25. Your Body mass index is 33.62 kg/m. If this is out of the aformentioned range listed, please consider follow up with your Primary Care Provider.   ________________________________________________________  The [facility] providers would like to encourage you to use MYCHART to communicate with providers for non-urgent requests or questions.  Due to long hold times on the telephone, sending your provider a message by [facility] may be a faster and more efficient way to get a response.  Please allow 48 business hours for a response.  Please remember that this is for non-urgent requests.  _______________________________________________________  [facility] is using a team-based approach to care.  Your team is made up of your doctor and two to three APPS. Our APPS (Nurse Practitioners and Physician Assistants) work with your physician to ensure care continuity for you. They are fully qualified to address your health concerns and develop a treatment plan. They communicate directly with your gastroenterologist to care for you. Seeing the Advanced  Practice Practitioners on your physician's team can help you by facilitating care more promptly, often allowing for earlier appointments, access to diagnostic testing, procedures, and other specialty referrals.

## 2024-07-05 ENCOUNTER — Other Ambulatory Visit: Payer: Self-pay | Admitting: Obstetrics and Gynecology

## 2024-07-05 DIAGNOSIS — Z1231 Encounter for screening mammogram for malignant neoplasm of breast: Secondary | ICD-10-CM

## 2024-07-09 DIAGNOSIS — I129 Hypertensive chronic kidney disease with stage 1 through stage 4 chronic kidney disease, or unspecified chronic kidney disease: Secondary | ICD-10-CM | POA: Diagnosis not present

## 2024-07-09 DIAGNOSIS — E782 Mixed hyperlipidemia: Secondary | ICD-10-CM | POA: Diagnosis not present

## 2024-07-16 DIAGNOSIS — I129 Hypertensive chronic kidney disease with stage 1 through stage 4 chronic kidney disease, or unspecified chronic kidney disease: Secondary | ICD-10-CM | POA: Diagnosis not present

## 2024-07-16 DIAGNOSIS — Z136 Encounter for screening for cardiovascular disorders: Secondary | ICD-10-CM | POA: Diagnosis not present

## 2024-07-16 DIAGNOSIS — Z139 Encounter for screening, unspecified: Secondary | ICD-10-CM | POA: Diagnosis not present

## 2024-07-16 DIAGNOSIS — Z0000 Encounter for general adult medical examination without abnormal findings: Secondary | ICD-10-CM | POA: Diagnosis not present

## 2024-07-16 DIAGNOSIS — Z1389 Encounter for screening for other disorder: Secondary | ICD-10-CM | POA: Diagnosis not present

## 2024-07-16 DIAGNOSIS — Z1331 Encounter for screening for depression: Secondary | ICD-10-CM | POA: Diagnosis not present

## 2024-07-16 DIAGNOSIS — E782 Mixed hyperlipidemia: Secondary | ICD-10-CM | POA: Diagnosis not present

## 2024-07-16 DIAGNOSIS — Z6832 Body mass index (BMI) 32.0-32.9, adult: Secondary | ICD-10-CM | POA: Diagnosis not present

## 2024-07-16 DIAGNOSIS — Z1339 Encounter for screening examination for other mental health and behavioral disorders: Secondary | ICD-10-CM | POA: Diagnosis not present

## 2024-07-16 DIAGNOSIS — N182 Chronic kidney disease, stage 2 (mild): Secondary | ICD-10-CM | POA: Diagnosis not present

## 2024-07-20 DIAGNOSIS — R82998 Other abnormal findings in urine: Secondary | ICD-10-CM | POA: Diagnosis not present

## 2024-07-20 DIAGNOSIS — Z6832 Body mass index (BMI) 32.0-32.9, adult: Secondary | ICD-10-CM | POA: Diagnosis not present

## 2024-07-20 DIAGNOSIS — N390 Urinary tract infection, site not specified: Secondary | ICD-10-CM | POA: Diagnosis not present

## 2024-08-29 ENCOUNTER — Other Ambulatory Visit: Payer: Self-pay | Admitting: Internal Medicine

## 2024-09-05 ENCOUNTER — Other Ambulatory Visit (HOSPITAL_COMMUNITY): Payer: Self-pay

## 2024-09-05 ENCOUNTER — Telehealth: Payer: Self-pay

## 2024-09-05 NOTE — Telephone Encounter (Signed)
 Pharmacy Patient Advocate Encounter   Received notification from CoverMyMeds that prior authorization for Stelara  90MG /ML syringes is required/requested.   Insurance verification completed.   The patient is insured through [employer].   Prior Authorization for Stelara  90MG /ML syringes has been APPROVED from 09-05-2024 to 09-05-2025   PA #/Case ID/Reference #: [id number]

## 2024-10-02 ENCOUNTER — Ambulatory Visit
Admission: RE | Admit: 2024-10-02 | Discharge: 2024-10-02 | Disposition: A | Discharge status: Home / Self Care | Source: Ambulatory Visit | Attending: Obstetrics and Gynecology | Admitting: Obstetrics and Gynecology

## 2024-10-02 DIAGNOSIS — Z1231 Encounter for screening mammogram for malignant neoplasm of breast: Secondary | ICD-10-CM

## 2024-10-11 ENCOUNTER — Other Ambulatory Visit: Payer: Self-pay

## 2024-10-11 ENCOUNTER — Other Ambulatory Visit (INDEPENDENT_AMBULATORY_CARE_PROVIDER_SITE_OTHER)

## 2024-10-11 DIAGNOSIS — E559 Vitamin D deficiency, unspecified: Secondary | ICD-10-CM

## 2024-10-11 DIAGNOSIS — K5010 Crohn's disease of large intestine without complications: Secondary | ICD-10-CM | POA: Diagnosis not present

## 2024-10-11 LAB — COMPREHENSIVE METABOLIC PANEL WITH GFR
ALT: 22 U/L (ref 3–35)
AST: 19 U/L (ref 5–37)
Albumin: 4.4 g/dL (ref 3.5–5.2)
Alkaline Phosphatase: 63 U/L (ref 39–117)
BUN: 17 mg/dL (ref 6–23)
CO2: 28 meq/L (ref 19–32)
Calcium: 9.4 mg/dL (ref 8.4–10.5)
Chloride: 104 meq/L (ref 96–112)
Creatinine, Ser: 0.71 mg/dL (ref 0.40–1.20)
GFR: 87.18 mL/min
Glucose, Bld: 93 mg/dL (ref 70–99)
Potassium: 3.7 meq/L (ref 3.5–5.1)
Sodium: 141 meq/L (ref 135–145)
Total Bilirubin: 0.6 mg/dL (ref 0.2–1.2)
Total Protein: 7 g/dL (ref 6.0–8.3)

## 2024-10-11 LAB — CBC WITH DIFFERENTIAL/PLATELET
Basophils Absolute: 0 K/uL (ref 0.0–0.1)
Basophils Relative: 0.4 % (ref 0.0–3.0)
Eosinophils Absolute: 0.1 K/uL (ref 0.0–0.7)
Eosinophils Relative: 1.6 % (ref 0.0–5.0)
HCT: 37.9 % (ref 36.0–46.0)
Hemoglobin: 13 g/dL (ref 12.0–15.0)
Lymphocytes Relative: 24.9 % (ref 12.0–46.0)
Lymphs Abs: 1.2 K/uL (ref 0.7–4.0)
MCHC: 34.4 g/dL (ref 30.0–36.0)
MCV: 88.6 fl (ref 78.0–100.0)
Monocytes Absolute: 0.4 K/uL (ref 0.1–1.0)
Monocytes Relative: 8.3 % (ref 3.0–12.0)
Neutro Abs: 3.1 K/uL (ref 1.4–7.7)
Neutrophils Relative %: 64.8 % (ref 43.0–77.0)
Platelets: 215 K/uL (ref 150.0–400.0)
RBC: 4.28 Mil/uL (ref 3.87–5.11)
RDW: 12.9 % (ref 11.5–15.5)
WBC: 4.8 K/uL (ref 4.0–10.5)

## 2024-10-11 LAB — VITAMIN D 25 HYDROXY (VIT D DEFICIENCY, FRACTURES): VITD: 36.27 ng/mL (ref 30.00–100.00)

## 2024-10-12 ENCOUNTER — Ambulatory Visit: Payer: Self-pay | Admitting: Internal Medicine

## 2024-10-12 DIAGNOSIS — K50013 Crohn's disease of small intestine with fistula: Secondary | ICD-10-CM

## 2024-10-14 LAB — QUANTIFERON-TB GOLD PLUS
Mitogen-NIL: 7.44 [IU]/mL
NIL: 0.01 [IU]/mL
QuantiFERON-TB Gold Plus: NEGATIVE
TB1-NIL: 0 [IU]/mL
TB2-NIL: 0.05 [IU]/mL

## 2024-10-26 ENCOUNTER — Encounter: Payer: Self-pay | Admitting: Internal Medicine

## 2024-10-26 ENCOUNTER — Other Ambulatory Visit: Payer: Self-pay | Admitting: Internal Medicine

## 2024-10-26 MED ORDER — PANTOPRAZOLE SODIUM 40 MG PO TBEC: 40.0000 mg | DELAYED_RELEASE_TABLET | Freq: Every day | ORAL | 1 refills | Status: DC

## 2024-10-26 NOTE — Telephone Encounter (Signed)
 Have her start pantoprazole 40 mg daily; she could be having some reflux causing mild esophagitis and worsening her dysphagia It should be noted her last EGD for dilation was 2025 (not 2026) Please schedule repeat EGD in [facility] for repeat dilation

## 2024-11-27 ENCOUNTER — Encounter

## 2024-12-11 ENCOUNTER — Encounter: Admitting: Internal Medicine
